# Patient Record
Sex: Female | Born: 1955 | Race: White | Hispanic: No | Marital: Married | Smoking: Never smoker
Health system: Southern US, Community
[De-identification: ages and names within clinical notes are randomized; demographics above are authoritative.]

## PROBLEM LIST (undated history)

## (undated) DIAGNOSIS — I82409 Acute embolism and thrombosis of unspecified deep veins of unspecified lower extremity: Secondary | ICD-10-CM

## (undated) DIAGNOSIS — J189 Pneumonia, unspecified organism: Secondary | ICD-10-CM

## (undated) DIAGNOSIS — M329 Systemic lupus erythematosus, unspecified: Secondary | ICD-10-CM

## (undated) DIAGNOSIS — I639 Cerebral infarction, unspecified: Secondary | ICD-10-CM

## (undated) DIAGNOSIS — M069 Rheumatoid arthritis, unspecified: Secondary | ICD-10-CM

## (undated) DIAGNOSIS — M797 Fibromyalgia: Secondary | ICD-10-CM

## (undated) DIAGNOSIS — C4491 Basal cell carcinoma of skin, unspecified: Secondary | ICD-10-CM

## (undated) DIAGNOSIS — R011 Cardiac murmur, unspecified: Secondary | ICD-10-CM

## (undated) DIAGNOSIS — D649 Anemia, unspecified: Secondary | ICD-10-CM

## (undated) DIAGNOSIS — D6851 Activated protein C resistance: Secondary | ICD-10-CM

## (undated) DIAGNOSIS — I10 Essential (primary) hypertension: Secondary | ICD-10-CM

## (undated) DIAGNOSIS — F419 Anxiety disorder, unspecified: Secondary | ICD-10-CM

## (undated) DIAGNOSIS — N390 Urinary tract infection, site not specified: Secondary | ICD-10-CM

## (undated) DIAGNOSIS — IMO0002 Reserved for concepts with insufficient information to code with codable children: Secondary | ICD-10-CM

## (undated) DIAGNOSIS — Z9109 Other allergy status, other than to drugs and biological substances: Secondary | ICD-10-CM

## (undated) DIAGNOSIS — R569 Unspecified convulsions: Secondary | ICD-10-CM

## (undated) HISTORY — DX: Fibromyalgia: M79.7

## (undated) HISTORY — DX: Cerebral infarction, unspecified: I63.9

## (undated) HISTORY — DX: Basal cell carcinoma of skin, unspecified: C44.91

## (undated) HISTORY — DX: Reserved for concepts with insufficient information to code with codable children: IMO0002

## (undated) HISTORY — DX: Other allergy status, other than to drugs and biological substances: Z91.09

## (undated) HISTORY — DX: Unspecified convulsions: R56.9

## (undated) HISTORY — PX: OTHER SURGICAL HISTORY: SHX169

## (undated) HISTORY — DX: Systemic lupus erythematosus, unspecified: M32.9

## (undated) HISTORY — DX: Rheumatoid arthritis, unspecified: M06.9

## (undated) HISTORY — DX: Activated protein C resistance: D68.51

## (undated) HISTORY — PX: KNEE ARTHROSCOPY: SUR90

## (undated) HISTORY — PX: TONSILLECTOMY AND ADENOIDECTOMY: SUR1326

## (undated) HISTORY — PX: EYE SURGERY: SHX253

---

## 1998-12-18 ENCOUNTER — Other Ambulatory Visit: Admission: RE | Admit: 1998-12-18 | Discharge: 1998-12-18 | Payer: Self-pay | Admitting: Obstetrics and Gynecology

## 2000-05-07 ENCOUNTER — Other Ambulatory Visit: Admission: RE | Admit: 2000-05-07 | Discharge: 2000-05-07 | Payer: Self-pay | Admitting: Obstetrics and Gynecology

## 2000-10-08 ENCOUNTER — Ambulatory Visit (HOSPITAL_COMMUNITY): Admission: RE | Admit: 2000-10-08 | Discharge: 2000-10-08 | Payer: Self-pay | Admitting: *Deleted

## 2001-08-31 ENCOUNTER — Other Ambulatory Visit: Admission: RE | Admit: 2001-08-31 | Discharge: 2001-08-31 | Payer: Self-pay | Admitting: Obstetrics & Gynecology

## 2002-09-03 ENCOUNTER — Other Ambulatory Visit: Admission: RE | Admit: 2002-09-03 | Discharge: 2002-09-03 | Payer: Self-pay | Admitting: Obstetrics & Gynecology

## 2004-01-16 ENCOUNTER — Other Ambulatory Visit: Admission: RE | Admit: 2004-01-16 | Discharge: 2004-01-16 | Payer: Self-pay | Admitting: Obstetrics and Gynecology

## 2011-08-01 LAB — HM MAMMOGRAPHY

## 2012-04-13 ENCOUNTER — Ambulatory Visit (INDEPENDENT_AMBULATORY_CARE_PROVIDER_SITE_OTHER): Payer: Managed Care, Other (non HMO) | Admitting: Family Medicine

## 2012-04-13 ENCOUNTER — Encounter: Payer: Self-pay | Admitting: Family Medicine

## 2012-04-13 VITALS — BP 114/76 | HR 83 | Temp 99.1°F | Ht 69.0 in | Wt 141.0 lb

## 2012-04-13 DIAGNOSIS — R51 Headache: Secondary | ICD-10-CM

## 2012-04-13 DIAGNOSIS — R519 Headache, unspecified: Secondary | ICD-10-CM | POA: Insufficient documentation

## 2012-04-13 DIAGNOSIS — R002 Palpitations: Secondary | ICD-10-CM

## 2012-04-13 DIAGNOSIS — R413 Other amnesia: Secondary | ICD-10-CM

## 2012-04-13 DIAGNOSIS — R350 Frequency of micturition: Secondary | ICD-10-CM

## 2012-04-13 DIAGNOSIS — IMO0001 Reserved for inherently not codable concepts without codable children: Secondary | ICD-10-CM

## 2012-04-13 DIAGNOSIS — H9209 Otalgia, unspecified ear: Secondary | ICD-10-CM | POA: Insufficient documentation

## 2012-04-13 LAB — POCT URINALYSIS DIPSTICK
Blood, UA: NEGATIVE
Protein, UA: NEGATIVE
Spec Grav, UA: 1.01
Urobilinogen, UA: 0.2

## 2012-04-13 NOTE — Progress Notes (Signed)
  Subjective:    Patient ID: Danielle Villegas, female    DOB: 21-Dec-1955, 56 y.o.   MRN: [id number]  HPI Pt here to establish.  She c/o b/l ear pain at night only when she lies down on her ear and then when she turns over and lays on other ear and then it hurts.  Occurs everynight and it is affecting her sleep.  It is no affecting her hearing.   Her memory is also troubling her.   Her daughter has noticed it as well.  She repeats things frequently --- questions etc.    Pt is also having a lot of headaches but she normally does with change in barametric pressure.  No vision changes. Pt also c/o odor in urine.  Review of Systems    as above Objective:   Physical Exam  Constitutional: She is oriented to person, place, and time. She appears well-developed and well-nourished.  HENT:  Right Ear: External ear normal.  Left Ear: External ear normal.  Mouth/Throat: Oropharynx is clear and moist.  Neck: Normal range of motion. Neck supple.  Cardiovascular: Normal rate, regular rhythm and normal heart sounds.   No murmur heard. Pulmonary/Chest: Effort normal and breath sounds normal. No respiratory distress. She has no wheezes. She has no rales.  Musculoskeletal: Normal range of motion. She exhibits no edema.  Neurological: She is alert and oriented to person, place, and time.       mmse  30/30  Psychiatric: She has a normal mood and affect. Her behavior is normal. Judgment and thought content normal.          Assessment & Plan:  Headaches and ear pain -- with some pnd                   claritin or zyrtec with dymista                    rto prn-------------------pt did not want referral yet   Memory loss--  Check labs ,  MMSE  30/30                        Urinary odor?---ua normal, culture sent,  rto for pelvic exam prn

## 2012-04-13 NOTE — Patient Instructions (Signed)
Headache and Allergies  The relationship between allergies and headaches is unclear. Many people with allergic or infectious nasal problems also have headaches (migraines or sinus headaches). However, sometimes allergies can cause pressure that feels like a headache, and sometimes headaches can cause allergy-like symptoms. It is not always clear whether your symptoms are caused by allergies or by a headache.  CAUSES    Migraine: The cause of a migraine is not always known.   Sinus Headache: The cause of a sinus headache may be a sinus infection. Other conditions that may be related to sinus headaches include:   Hay fever (allergic rhinitis).   Deviation of the nasal septum.   Swelling or clogging of the nasal passages.  SYMPTOMS   Migraine headache symptoms (which often last 4 to 72 hours) include:   Intense, throbbing pain on one or both sides of the head.   Nausea.   Vomiting.   Being extra sensitive to light.   Being extra sensitive to sound.   Nervous system reactions that appear similar to an allergic reaction:   Stuffy nose.   Runny nose.   Tearing.  Sinus headaches are felt as facial pain or pressure.   DIAGNOSIS   Because there is some overlap in symptoms, sinus and migraine headaches are often misdiagnosed. For example, a person with migraines may also feel facial pressure. Likewise, many people with hay fever may get migraine headaches rather than sinus headaches. These migraines can be triggered by the histamine release during an allergic reaction. An antihistamine medicine can eliminate this pain.  There are standard criteria that help clarify the difference between these headaches and related allergy or allergy-like symptoms. Your caregiver can use these criteria to determine the proper diagnosis and provide you the best care.  TREATMENT   Migraine medicine may help people who have persistent migraine headaches even though their hay fever is controlled. For some people, anti-inflammatory  treatments do not work to relieve migraines. Medicines called triptans (such as sumatriptan) can be helpful for those people.  Document Released: 12/07/2003 Document Revised: 09/05/2011 Document Reviewed: 12/29/2009  [employer] Patient Information 2012 [employer].

## 2012-04-14 LAB — BASIC METABOLIC PANEL
BUN: 22 mg/dL (ref 6–23)
Calcium: 9.6 mg/dL (ref 8.4–10.5)
GFR: 98.09 mL/min (ref >60.00)
Glucose, Bld: 111 mg/dL — ABNORMAL HIGH (ref 70–99)

## 2012-04-14 LAB — CBC WITH DIFFERENTIAL/PLATELET
Basophils Absolute: 0 10*3/uL (ref 0.0–0.1)
Lymphocytes Relative: 7.1 % — ABNORMAL LOW (ref 12.0–46.0)
Lymphs Abs: 0.3 10*3/uL — ABNORMAL LOW (ref 0.7–4.0)
Monocytes Relative: 10.5 % (ref 3.0–12.0)
Platelets: 167 10*3/uL (ref 150.0–400.0)
RDW: 13.2 % (ref 11.5–14.6)

## 2012-04-14 LAB — HEPATIC FUNCTION PANEL
AST: 30 U/L (ref 0–37)
Alkaline Phosphatase: 74 U/L (ref 39–117)
Total Bilirubin: 0.7 mg/dL (ref 0.3–1.2)

## 2012-04-14 LAB — SEDIMENTATION RATE: Sed Rate: 21 mm/hr (ref 0–22)

## 2012-04-15 LAB — TSH: TSH: 0.94 u[IU]/mL (ref 0.35–5.50)

## 2012-04-16 LAB — URINE CULTURE

## 2012-04-17 ENCOUNTER — Other Ambulatory Visit: Payer: Self-pay

## 2012-04-17 MED ORDER — CIPROFLOXACIN HCL 250 MG PO TABS: 250.0000 mg | ORAL_TABLET | Freq: Two times a day (BID) | ORAL | Status: AC

## 2012-04-17 MED ORDER — PREDNISONE 1 MG PO TABS: 2.0000 mg | ORAL_TABLET | Freq: Every day | ORAL | Status: DC

## 2012-04-17 MED ORDER — PREDNISONE 5 MG PO TABS: 5.0000 mg | ORAL_TABLET | Freq: Every day | ORAL | Status: DC

## 2012-04-17 NOTE — Telephone Encounter (Signed)
+   UTI cipro 250 mg bid for 3 days

## 2012-04-20 MED ORDER — PREDNISONE 1 MG PO TABS: 2.0000 mg | ORAL_TABLET | Freq: Every day | ORAL | Status: DC

## 2012-04-20 MED ORDER — PREDNISONE 5 MG PO TABS: 5.0000 mg | ORAL_TABLET | Freq: Every day | ORAL | Status: DC

## 2012-04-20 NOTE — Telephone Encounter (Signed)
Discussed with patient and she voiced understanding Rx sent to the pharmacy in [place], and she also requested prednisone to be sent to [facility].     KP

## 2012-04-20 NOTE — Telephone Encounter (Signed)
E-scribing error on Rx---states the Rx did not go through. Refaxed     KP

## 2012-04-20 NOTE — Addendum Note (Signed)
Addended by: Arnette Norris on: 04/20/2012 08:20 AM   Modules accepted: Orders

## 2012-05-12 ENCOUNTER — Telehealth: Payer: Self-pay | Admitting: *Deleted

## 2012-05-12 NOTE — Telephone Encounter (Signed)
Pt states that when she was seen on 04-13-12 she was advised that she had a premature heart beat. Pt would like to know where in the heart this is located..Please advise

## 2012-05-12 NOTE — Telephone Encounter (Signed)
Premature atrial beat

## 2012-05-12 NOTE — Telephone Encounter (Signed)
Patient aware and she voiced understanding.     KP 

## 2012-06-29 ENCOUNTER — Ambulatory Visit (INDEPENDENT_AMBULATORY_CARE_PROVIDER_SITE_OTHER): Payer: Managed Care, Other (non HMO)

## 2012-06-29 DIAGNOSIS — Z23 Encounter for immunization: Secondary | ICD-10-CM

## 2012-09-10 ENCOUNTER — Emergency Department (HOSPITAL_COMMUNITY): Payer: Managed Care, Other (non HMO)

## 2012-09-10 ENCOUNTER — Emergency Department (HOSPITAL_COMMUNITY)
Admission: EM | Admit: 2012-09-10 | Discharge: 2012-09-10 | Disposition: A | Discharge status: Home / Self Care | Payer: Managed Care, Other (non HMO) | Attending: Emergency Medicine | Admitting: Emergency Medicine

## 2012-09-10 ENCOUNTER — Encounter (HOSPITAL_COMMUNITY): Payer: Self-pay | Admitting: Emergency Medicine

## 2012-09-10 DIAGNOSIS — R269 Unspecified abnormalities of gait and mobility: Secondary | ICD-10-CM | POA: Insufficient documentation

## 2012-09-10 DIAGNOSIS — M329 Systemic lupus erythematosus, unspecified: Secondary | ICD-10-CM | POA: Insufficient documentation

## 2012-09-10 DIAGNOSIS — Z7982 Long term (current) use of aspirin: Secondary | ICD-10-CM | POA: Insufficient documentation

## 2012-09-10 DIAGNOSIS — C4491 Basal cell carcinoma of skin, unspecified: Secondary | ICD-10-CM | POA: Insufficient documentation

## 2012-09-10 DIAGNOSIS — IMO0001 Reserved for inherently not codable concepts without codable children: Secondary | ICD-10-CM | POA: Insufficient documentation

## 2012-09-10 DIAGNOSIS — Z79899 Other long term (current) drug therapy: Secondary | ICD-10-CM | POA: Insufficient documentation

## 2012-09-10 DIAGNOSIS — Y939 Activity, unspecified: Secondary | ICD-10-CM | POA: Insufficient documentation

## 2012-09-10 DIAGNOSIS — Y929 Unspecified place or not applicable: Secondary | ICD-10-CM | POA: Insufficient documentation

## 2012-09-10 DIAGNOSIS — X500XXA Overexertion from strenuous movement or load, initial encounter: Secondary | ICD-10-CM | POA: Insufficient documentation

## 2012-09-10 DIAGNOSIS — M129 Arthropathy, unspecified: Secondary | ICD-10-CM | POA: Insufficient documentation

## 2012-09-10 DIAGNOSIS — D6859 Other primary thrombophilia: Secondary | ICD-10-CM | POA: Insufficient documentation

## 2012-09-10 DIAGNOSIS — Z8673 Personal history of transient ischemic attack (TIA), and cerebral infarction without residual deficits: Secondary | ICD-10-CM | POA: Insufficient documentation

## 2012-09-10 DIAGNOSIS — S83006A Unspecified dislocation of unspecified patella, initial encounter: Secondary | ICD-10-CM | POA: Insufficient documentation

## 2012-09-10 DIAGNOSIS — R61 Generalized hyperhidrosis: Secondary | ICD-10-CM | POA: Insufficient documentation

## 2012-09-10 MED ORDER — ONDANSETRON HCL 4 MG/2ML IJ SOLN
4.0000 mg | Freq: Once | INTRAMUSCULAR | Status: AC
  Administered 2012-09-10: 4 mg via INTRAVENOUS
  Filled 2012-09-10: qty 2

## 2012-09-10 MED ORDER — HYDROMORPHONE HCL PF 2 MG/ML IJ SOLN
2.0000 mg | Freq: Once | INTRAMUSCULAR | Status: AC
  Administered 2012-09-10: 2 mg via INTRAVENOUS
  Filled 2012-09-10: qty 1

## 2012-09-10 MED ORDER — HYDROMORPHONE HCL PF 1 MG/ML IJ SOLN
1.0000 mg | Freq: Once | INTRAMUSCULAR | Status: AC
  Administered 2012-09-10: 1 mg via INTRAVENOUS

## 2012-09-10 MED ORDER — OXYCODONE-ACETAMINOPHEN 5-325 MG PO TABS
1.0000 | ORAL_TABLET | ORAL | Status: DC | PRN
Start: 2012-09-10 — End: 2012-09-14

## 2012-09-10 MED ORDER — HYDROMORPHONE HCL PF 1 MG/ML IJ SOLN
INTRAMUSCULAR | Status: AC
  Administered 2012-09-10: 1 mg via INTRAVENOUS
  Filled 2012-09-10: qty 1

## 2012-09-10 NOTE — ED Provider Notes (Signed)
History     CSN: [id number]  Arrival date & time 09/10/12  1123   First MD Initiated Contact with Patient 09/10/12 1228      Chief Complaint  Patient presents with  . Knee Injury    (Consider location/radiation/quality/duration/timing/severity/associated sxs/prior treatment) Patient is a 56 y.o. female presenting with knee pain. The history is provided by the patient and the spouse. No language interpreter was used.  Knee Pain This is a new problem. The current episode started today. The problem occurs constantly. The problem has been unchanged. Associated symptoms include diaphoresis. Pertinent negatives include no fever, nausea, numbness or vomiting. The symptoms are aggravated by bending. She has tried nothing for the symptoms.   56 year old female here today with a complaint of left knee pain after stepping off curve. Defomity noted. + cms below injury.  Prior injury to the knee in the remote past.    Past Medical History  Diagnosis Date  . Basal cell carcinoma   . Fibromyalgia   . Lupus   . RA (rheumatoid arthritis)   . Factor 5 Leiden mutation, heterozygous   . Environmental allergies   . Stroke     1997  . Seizure     1997    Past Surgical History  Procedure Date  . Tonsillectomy and adenoidectomy     Family History  Problem Relation Age of Onset  . Alcohol abuse    . Arthritis    . Breast cancer    . Prostate cancer Father   . Stroke    . Hypertension    . Heart disease      History  Substance Use Topics  . Smoking status: Never Smoker   . Smokeless tobacco: Never Used  . Alcohol Use: Yes     Comment: Rarely    OB History    Grav Para Term Preterm Abortions TAB SAB Ect Mult Living                  Review of Systems  Constitutional: Positive for diaphoresis. Negative for fever.  HENT: Negative.   Eyes: Negative.   Respiratory: Negative.   Cardiovascular: Negative.   Gastrointestinal: Negative.  Negative for nausea and vomiting.   Musculoskeletal: Positive for gait problem.       L knee pain  Neurological: Negative.  Negative for numbness.  Psychiatric/Behavioral: Negative.   All other systems reviewed and are negative.    Allergies  Dilantin and Sulfa antibiotics  Home Medications   Current Outpatient Rx  Name  Route  Sig  Dispense  Refill  . ASPIRIN EC 81 MG PO TBEC   Oral   Take 162 mg by mouth daily.         . LUTEIN PO   Oral   Take 1 tablet by mouth daily.         . ADULT MULTIVITAMIN W/MINERALS CH   Oral   Take 1 tablet by mouth daily.         Marland Kitchen PREDNISONE 1 MG PO TABS   Oral   Take 2 tablets (2 mg total) by mouth daily.   180 tablet   3   . PREDNISONE 5 MG PO TABS   Oral   Take 1 tablet (5 mg total) by mouth daily.   90 tablet   3     BP 171/78  Pulse 78  Temp 97.5 F (36.4 C) (Oral)  Resp 20  SpO2 98%  Physical Exam  Nursing note and vitals  reviewed. Constitutional: She is oriented to person, place, and time. She appears well-developed and well-nourished.  HENT:  Head: Normocephalic and atraumatic.  Eyes: Conjunctivae normal and EOM are normal. Pupils are equal, round, and reactive to light.  Neck: Normal range of motion. Neck supple.  Cardiovascular: Normal rate.   Pulmonary/Chest: Effort normal.  Abdominal: Soft.  Musculoskeletal: She exhibits edema and tenderness.       L knee tenderness and deformity.  + cs below injury Limited rom due to pain  Neurological: She is alert and oriented to person, place, and time. She has normal reflexes.  Skin: Skin is warm and dry.  Psychiatric: She has a normal mood and affect.    ED Course  Reduction of dislocation Date/Time: 09/10/2012 2:49 PM Performed by: Remi Haggard Authorized by: Remi Haggard Consent: Verbal consent obtained. Written consent not obtained. Risks and benefits: risks, benefits and alternatives were discussed Consent given by: patient Patient understanding: patient states understanding of the  procedure being performed Imaging studies: imaging studies available Patient identity confirmed: verbally with patient Time out: Immediately prior to procedure a "time out" was called to verify the correct patient, procedure, equipment, support staff and site/side marked as required. Patient tolerance: Patient tolerated the procedure well with no immediate complications. Comments: L patella lateral dislocation reduced by resident at bedside after 3 mg of dilaudid.   (including critical care time)  Labs Reviewed - No data to display Dg Knee 1-2 Views Left  09/10/2012  *RADIOLOGY REPORT*  Clinical Data: Injured knee stepping off curve  LEFT KNEE - 1-2 VIEW  Comparison: None.  Findings: On the frontal view the patella appears to be dislocated laterally.  No acute fracture is seen.  Joint effusion is difficult to assess.  IMPRESSION: Apparent lateral dislocation of the patella.  Correlate clinically.   Original Report Authenticated By: Dwyane Dee, M.D.      No diagnosis found.    MDM  L patella dislocation reduced.  Dilaudid 3 mg given prior.  Joint effusion noted after reduction.  Leg immobilizer and crutches provided.  Follow up with orthopedics.  + cms below injury.  No fracture per x-ray reviewed by myself.  Shared visit with Dr. Clarene Duke and resident.        Remi Haggard, NP 09/10/12 2152

## 2012-09-10 NOTE — Progress Notes (Signed)
Orthopedic Tech Progress Note Patient Details:  Danielle Villegas 1956/09/24 [id number]  Ortho Devices Type of Ortho Device: Knee Immobilizer Ortho Device/Splint Location: left knee Ortho Device/Splint Interventions: Application   Eidan Muellner 09/10/2012, 1:16 PM

## 2012-09-10 NOTE — Progress Notes (Signed)
Orthopedic Tech Progress Note Patient Details:  Danielle Villegas [birth year] [id number]  Patient ID: Danielle Villegas, female   DOB: October 24, 1955, 56 y.o.   MRN: [id number] Pt already has crutches;rn notified  Nikki Dom 09/10/2012, 1:16 PM

## 2012-09-10 NOTE — Progress Notes (Signed)
Orthopedic Tech Progress Note Patient Details:  Danielle Villegas 27-Jan-1956 [id number]  Ortho Devices Type of Ortho Device: Crutches Ortho Device/Splint Location: left knee Ortho Device/Splint Interventions: Application   [name] 09/10/2012, 2:42 PM

## 2012-09-10 NOTE — ED Notes (Signed)
Patient tripped and developed pain left knee. Lowered self to ground.  EMS called stated obvious deformity left knee cap. History of dislocating left knee.  Pain improved with total of Fentanyl IVP. Pedal pulse +2 and able to move all toes.

## 2012-09-11 ENCOUNTER — Encounter (HOSPITAL_COMMUNITY): Payer: Self-pay | Admitting: Emergency Medicine

## 2012-09-11 ENCOUNTER — Emergency Department (HOSPITAL_COMMUNITY): Payer: Managed Care, Other (non HMO)

## 2012-09-11 ENCOUNTER — Observation Stay (HOSPITAL_COMMUNITY)
Admission: EM | Admit: 2012-09-11 | Discharge: 2012-09-14 | Disposition: A | Discharge status: Skilled Nursing Facility | Payer: Managed Care, Other (non HMO) | Attending: Family Medicine | Admitting: Family Medicine

## 2012-09-11 ENCOUNTER — Telehealth: Payer: Self-pay | Admitting: Family Medicine

## 2012-09-11 DIAGNOSIS — S83006A Unspecified dislocation of unspecified patella, initial encounter: Principal | ICD-10-CM | POA: Diagnosis present

## 2012-09-11 DIAGNOSIS — M069 Rheumatoid arthritis, unspecified: Secondary | ICD-10-CM | POA: Insufficient documentation

## 2012-09-11 DIAGNOSIS — W1789XA Other fall from one level to another, initial encounter: Secondary | ICD-10-CM | POA: Insufficient documentation

## 2012-09-11 DIAGNOSIS — Z8673 Personal history of transient ischemic attack (TIA), and cerebral infarction without residual deficits: Secondary | ICD-10-CM | POA: Insufficient documentation

## 2012-09-11 DIAGNOSIS — M329 Systemic lupus erythematosus, unspecified: Secondary | ICD-10-CM | POA: Insufficient documentation

## 2012-09-11 DIAGNOSIS — Z7982 Long term (current) use of aspirin: Secondary | ICD-10-CM | POA: Insufficient documentation

## 2012-09-11 DIAGNOSIS — D649 Anemia, unspecified: Secondary | ICD-10-CM

## 2012-09-11 DIAGNOSIS — D696 Thrombocytopenia, unspecified: Secondary | ICD-10-CM

## 2012-09-11 DIAGNOSIS — I824Z9 Acute embolism and thrombosis of unspecified deep veins of unspecified distal lower extremity: Secondary | ICD-10-CM

## 2012-09-11 DIAGNOSIS — IMO0002 Reserved for concepts with insufficient information to code with codable children: Secondary | ICD-10-CM | POA: Insufficient documentation

## 2012-09-11 DIAGNOSIS — R509 Fever, unspecified: Secondary | ICD-10-CM | POA: Diagnosis present

## 2012-09-11 DIAGNOSIS — D6859 Other primary thrombophilia: Secondary | ICD-10-CM | POA: Insufficient documentation

## 2012-09-11 LAB — CBC
HCT: 34.5 % — ABNORMAL LOW (ref 36.0–46.0)
Hemoglobin: 11.3 g/dL — ABNORMAL LOW (ref 12.0–15.0)
MCH: 30.1 pg (ref 26.0–34.0)
MCHC: 32.8 g/dL (ref 30.0–36.0)
MCV: 92 fL (ref 78.0–100.0)
Platelets: 146 10*3/uL — ABNORMAL LOW (ref 150–400)
RBC: 3.75 MIL/uL — ABNORMAL LOW (ref 3.87–5.11)
RDW: 13.6 % (ref 11.5–15.5)
WBC: 6.2 10*3/uL (ref 4.0–10.5)

## 2012-09-11 LAB — POCT I-STAT, CHEM 8
BUN: 14 mg/dL (ref 6–23)
Calcium, Ion: 1.12 mmol/L (ref 1.12–1.23)
Chloride: 107 mEq/L (ref 96–112)
Creatinine, Ser: 0.8 mg/dL (ref 0.50–1.10)
Glucose, Bld: 106 mg/dL — ABNORMAL HIGH (ref 70–99)
HCT: 34 % — ABNORMAL LOW (ref 36.0–46.0)
Hemoglobin: 11.6 g/dL — ABNORMAL LOW (ref 12.0–15.0)
Potassium: 3.7 mEq/L (ref 3.5–5.1)
Sodium: 140 mEq/L (ref 135–145)
TCO2: 22 mmol/L (ref 0–100)

## 2012-09-11 MED ORDER — HEPARIN SODIUM (PORCINE) 5000 UNIT/ML IJ SOLN
5000.0000 [IU] | Freq: Three times a day (TID) | INTRAMUSCULAR | Status: DC
  Administered 2012-09-11 – 2012-09-12 (×2): 5000 [IU] via SUBCUTANEOUS
  Filled 2012-09-11 (×6): qty 1

## 2012-09-11 MED ORDER — DIAZEPAM 5 MG/ML IJ SOLN
5.0000 mg | Freq: Four times a day (QID) | INTRAMUSCULAR | Status: DC | PRN
  Administered 2012-09-11 – 2012-09-12 (×2): 5 mg via INTRAVENOUS
  Filled 2012-09-11 (×2): qty 2

## 2012-09-11 MED ORDER — ACETAMINOPHEN 325 MG PO TABS
650.0000 mg | ORAL_TABLET | Freq: Four times a day (QID) | ORAL | Status: DC | PRN
  Administered 2012-09-11: 650 mg via ORAL
  Filled 2012-09-11: qty 2

## 2012-09-11 MED ORDER — FENTANYL CITRATE 0.05 MG/ML IJ SOLN
100.0000 ug | Freq: Once | INTRAMUSCULAR | Status: AC
  Administered 2012-09-11: 100 ug via INTRAVENOUS
  Filled 2012-09-11: qty 2

## 2012-09-11 MED ORDER — ASPIRIN EC 81 MG PO TBEC
162.0000 mg | DELAYED_RELEASE_TABLET | Freq: Every day | ORAL | Status: DC
  Administered 2012-09-12: 162 mg via ORAL
  Filled 2012-09-11: qty 2

## 2012-09-11 MED ORDER — DIAZEPAM 5 MG/ML IJ SOLN
5.0000 mg | Freq: Once | INTRAMUSCULAR | Status: AC
Start: 2012-09-11 — End: 2012-09-11
  Administered 2012-09-11: 5 mg via INTRAVENOUS
  Filled 2012-09-11: qty 2

## 2012-09-11 MED ORDER — SODIUM CHLORIDE 0.9 % IV SOLN: 250.0000 mL | INTRAVENOUS | Status: DC | PRN

## 2012-09-11 MED ORDER — HYDROMORPHONE HCL PF 1 MG/ML IJ SOLN
0.5000 mg | INTRAMUSCULAR | Status: DC | PRN
  Administered 2012-09-12 – 2012-09-13 (×4): 1 mg via INTRAVENOUS
  Filled 2012-09-11 (×5): qty 1

## 2012-09-11 MED ORDER — SODIUM CHLORIDE 0.9 % IJ SOLN: 3.0000 mL | INTRAMUSCULAR | Status: DC | PRN

## 2012-09-11 MED ORDER — ASPIRIN 81 MG PO CHEW
CHEWABLE_TABLET | ORAL | Status: AC
  Filled 2012-09-11: qty 2

## 2012-09-11 MED ORDER — SODIUM CHLORIDE 0.9 % IV SOLN
INTRAVENOUS | Status: AC
  Administered 2012-09-11: 22:00:00 via INTRAVENOUS

## 2012-09-11 MED ORDER — SODIUM CHLORIDE 0.9 % IJ SOLN
3.0000 mL | Freq: Two times a day (BID) | INTRAMUSCULAR | Status: DC
  Administered 2012-09-12 – 2012-09-13 (×3): 3 mL via INTRAVENOUS

## 2012-09-11 MED ORDER — ADULT MULTIVITAMIN W/MINERALS CH
1.0000 | ORAL_TABLET | Freq: Every day | ORAL | Status: DC
  Administered 2012-09-11 – 2012-09-14 (×4): 1 via ORAL
  Filled 2012-09-11 (×4): qty 1

## 2012-09-11 MED ORDER — PREDNISONE 1 MG PO TABS
2.0000 mg | ORAL_TABLET | Freq: Every day | ORAL | Status: DC
  Administered 2012-09-12 – 2012-09-14 (×3): 2 mg via ORAL
  Filled 2012-09-11 (×3): qty 2

## 2012-09-11 MED ORDER — PREDNISONE 5 MG PO TABS
5.0000 mg | ORAL_TABLET | Freq: Every day | ORAL | Status: DC
  Administered 2012-09-12 – 2012-09-14 (×3): 5 mg via ORAL
  Filled 2012-09-11 (×3): qty 1

## 2012-09-11 NOTE — ED Notes (Signed)
Rx for Flexeril 10 mg 1 TID PRN muscle Spasms #20 per Dr Adriana Simas.

## 2012-09-11 NOTE — Telephone Encounter (Signed)
Patient is in the ED 09/11/12---   KP

## 2012-09-11 NOTE — Telephone Encounter (Signed)
Attempted to reach patient without success.  Left identified message on voicemail to return call as needed.

## 2012-09-11 NOTE — ED Notes (Signed)
Per EMS: Pt seen yesterday for fall, dislocated left patella, pt given knee immobilizer and instructions to follow up with ortho. Pt reports left knee to hip muscle spasms. Pt reports 10/10 pain with activity. 3/10 pain at rest. 250 mcg Fentanyl given by EMS.

## 2012-09-11 NOTE — ED Provider Notes (Addendum)
History     CSN: [id number]  Arrival date & time 09/11/12  1453   First MD Initiated Contact with Patient 09/11/12 1521      Chief Complaint  Patient presents with  . Spasms  . Fall    (Consider location/radiation/quality/duration/timing/severity/associated sxs/prior treatment) Patient is a 56 y.o. female presenting with fall. The history is provided by the patient.  Fall Pertinent negatives include no fever and no numbness.  patient stepped of a curb yesterday and dislocated her left patella. Previous history of "floating patella", but has not previously dislocated it. Since she was in the ED yesterday she has had spasms that come and go. It goes from the knee to the hip. It is severe. Worse with movement. She states that she has been unable to get up to the second floor of her house.   Past Medical History  Diagnosis Date  . Basal cell carcinoma   . Fibromyalgia   . Lupus   . RA (rheumatoid arthritis)   . Factor 5 Leiden mutation, heterozygous   . Environmental allergies   . Stroke     1997  . Seizure     1997    Past Surgical History  Procedure Date  . Tonsillectomy and adenoidectomy     Family History  Problem Relation Age of Onset  . Alcohol abuse    . Arthritis    . Breast cancer    . Prostate cancer Father   . Stroke    . Hypertension    . Heart disease      History  Substance Use Topics  . Smoking status: Never Smoker   . Smokeless tobacco: Never Used  . Alcohol Use: Yes     Comment: Rarely    OB History    Grav Para Term Preterm Abortions TAB SAB Ect Mult Living                  Review of Systems  Constitutional: Negative for fever and chills.  Musculoskeletal: Positive for joint swelling. Negative for back pain.  Skin: Negative for wound.  Neurological: Negative for dizziness, tremors and numbness.    Allergies  Dilantin and Sulfa antibiotics  Home Medications   No current outpatient prescriptions on file.  BP 139/83  Pulse 99   Temp 100.1 F (37.8 C) (Oral)  Resp 17  Ht 5\' 9"  (1.753 m)  Wt 145 lb (65.772 kg)  BMI 21.41 kg/m2  SpO2 99%  Physical Exam  Constitutional: She appears well-developed and well-nourished.  Cardiovascular: Normal rate and regular rhythm.   Pulmonary/Chest: Effort normal and breath sounds normal. No respiratory distress.  Abdominal: There is no tenderness.  Musculoskeletal:       Patient has brief spasms of left lower extremity. Pain with palpation of knee.   Neurological: She is alert.  NV intact distally in LLE. Mild edema.   ED Course  Procedures (including critical care time)  Labs Reviewed  CBC - Abnormal; Notable for the following:    RBC 3.75 (*)     Hemoglobin 11.3 (*)     HCT 34.5 (*)     Platelets 146 (*)     All other components within normal limits  POCT I-STAT, CHEM 8 - Abnormal; Notable for the following:    Glucose, Bld 106 (*)     Hemoglobin 11.6 (*)     HCT 34.0 (*)     All other components within normal limits  CBC - Abnormal; Notable for the following:  RBC 3.54 (*)     Hemoglobin 10.7 (*)     HCT 32.6 (*)     Platelets 120 (*)     All other components within normal limits  GLUCOSE, CAPILLARY  CULTURE, BLOOD (ROUTINE X 2)  CULTURE, BLOOD (ROUTINE X 2)  URINALYSIS, ROUTINE W REFLEX MICROSCOPIC  URINE CULTURE   Dg Knee 2 Views Left  09/11/2012  *RADIOLOGY REPORT*  Clinical Data: Knee pain and spasms.  Patellar dislocation yesterday.  LEFT KNEE - 1-2 VIEW  Comparison: 09/10/2012  Findings: Two-view exam shows no gross fracture.  No subluxation or dislocation.  There is a joint effusion.  Meniscal mineralization again noted.  IMPRESSION: No evidence for patellar dislocation.  No gross fracture.  Joint effusion.   Original Report Authenticated By: Kennith Center, M.D.    Mr Knee Left  Wo Contrast  09/11/2012  *RADIOLOGY REPORT*  Clinical Data:  Left knee pain after fall.  MRI OF THE LEFT KNEE WITHOUT CONTRAST  Technique:  Multiplanar, multisequence MR  imaging of the left knee was performed.  No intravenous contrast was administered.  Comparison:  Radiographs dated 12/13 and 09/10/2012  FINDINGS: There is evidence of an acute transient lateral dislocation of the patella with a subtle impaction fracture of the lateral aspect of the lateral femoral condyle and at a tiny area of avulsion of the inferior medial aspect of the patella as well as a partial avulsion of the medial patellofemoral ligament from the patella.                              MENISCI Medial:  Normal. Lateral:  Normal.  LIGAMENTS Cruciates:  Normal. Collaterals:  Normal.  CARTILAGE Patellofemoral:  The patient has a sheared off a large full- thickness portion of the articular cartilage of the lateral facet of the patella measuring at least 2.6 x 2.3 cm. Medial:  Normal. Lateral:  Normal.  Joint:  Large joint effusion with blood products and fat globules from the tear is and subtle fractures. Popliteal Fossa:  No Baker's cyst.  Extravasated joint fluid in the soft tissues.  Extensor Mechanism: Distal quadriceps tendon and patellar tendon are normal.  Extensive partial tear of the medial patellofemoral ligament at the patellar attachment with a small avulsion of the cortex of the patella inferior medially. Bones: Subtle impaction fracture of the lateral aspect of the lateral femoral condyle due to the transient patellar dislocation. Small avulsion of bone from the inferior medial aspect of the patella.  These are both quite subtle injuries.  IMPRESSION:  1.  Evidence of recent transient dislocation of the patella with secondary large joint effusion and hemarthrosis. 2.  Subtle impaction fracture of the lateral aspect of the lateral femoral condyle. 3.  Partial avulsion of the medial patellofemoral ligament with a small avulsion of bone from the inferior medial aspect of patella. 4.  Large avulsed full-thickness segment of cartilage from the lateral facet of the patella.   Original Report Authenticated  By: Francene Boyers, M.D.      1. Patellar dislocation   2. Fever       MDM  Patient with left knee pain after fall day prior to arrival. Patellar dislocation at that time that was reduced in the ED. Pain uncontrolled and has spasms. No relief with flexeril at home. MRI done here after discussion with ortho. Patient unable to manage at home and was admitted to medicine. No chest pain.  History of Factor 5 leiden. No chest pain at this time.      Juliet Rude. Rubin Payor, MD 09/12/12 1522  Juliet Rude. Rubin Payor, MD 09/12/12 [phone]

## 2012-09-11 NOTE — ED Provider Notes (Signed)
Medical screening examination/treatment/procedure(s) were conducted as a shared visit with non-physician practitioner(s) and myself.  I personally evaluated the patient during the encounter   56yo F, c/o left knee pain.  States "for years since I was a teenager" her left patella has "popped in and out of place."  States she was told she "needed surgery on it years ago" for "weak ligaments" but never did.  States she stepped off a curb today and "felt it go out again."  Endorses it "didn't pop back in" so she came to the ED for further eval.  A&O, very anxious, CTA, RRR, +left patellar lateral dislocation, +small joint effusion. NMS intact left foot.  Pelvis stable. NT left hip/ankle/foot. No open wounds. XR without fracture.  Medicated for pain and anxiety.  Left patella easily reduced back to anatomical position with immediate improvement in pain.  Knee immobilizer placed with knee in full extension.  XR without fracture.  Crutches given.  Strict instructions to f/u with Ortho MD given to pt and spouse.  Verb understanding.     Laray Anger, DO 09/11/12 [phone]

## 2012-09-11 NOTE — ED Notes (Signed)
Patient transported to MRI 

## 2012-09-11 NOTE — ED Notes (Signed)
Pt returned from MRI °

## 2012-09-11 NOTE — Plan of Care (Signed)
Problem: Phase I Progression Outcomes Goal: OOB as tolerated unless otherwise ordered Outcome: Not Progressing Will get up with PT tomorrow Erick Colace

## 2012-09-11 NOTE — H&P (Signed)
[facility] History and Physical  Danielle Villegas ZOX:[id number] DOB: 1956-04-04 DOA: 09/11/2012  Referring physician: ED PCP: Loreen Freud, DO  Specialists: Orthopaedic surgery  Chief Complaint: Knee pain, muscle spasms  HPI: Danielle Villegas is a 56 y.o. female who stepped off of a curb yesterday and dislocated her L patella.  Previous history of "floating patella" in the past but this the first time she dislocated it.  Was in ED yesterday due to pain with spasms that are located from the knee all the way to the hip.  Spasms and pain are severe, worse with movement, better at rest.  Her husband has had to stay home from work to care for her, she has not been able to get up to the second floor of her house.  In the ED MRI was performed with findings as noted below.  The patient was febrile with T of 102.3 after admission.  No other symptoms and no obvious source of infection to explain fever.  Review of Systems: 12 systems reviewed and negative except as per HPI.  Past Medical History  Diagnosis Date  . Basal cell carcinoma   . Fibromyalgia   . Lupus   . RA (rheumatoid arthritis)   . Factor 5 Leiden mutation, heterozygous   . Environmental allergies   . Stroke     1997  . Seizure     1997   Past Surgical History  Procedure Date  . Tonsillectomy and adenoidectomy    Social History:  reports that she has never smoked. She has never used smokeless tobacco. She reports that she drinks alcohol. She reports that she does not use illicit drugs.  Allergies  Allergen Reactions  . Dilantin (Phenytoin Sodium Extended) Other (See Comments)    Dizziness  . Sulfa Antibiotics Other (See Comments)    Body Stiffness    Family History  Problem Relation Age of Onset  . Alcohol abuse    . Arthritis    . Breast cancer    . Prostate cancer Father   . Stroke    . Hypertension    . Heart disease      Prior to Admission medications   Medication Sig Start Date End Date  Taking? Authorizing Provider  aspirin EC 81 MG tablet Take 162 mg by mouth daily.   Yes Historical Provider, MD  LUTEIN PO Take 1 tablet by mouth daily.   Yes Historical Provider, MD  Multiple Vitamin (MULTIVITAMIN WITH MINERALS) TABS Take 1 tablet by mouth daily.   Yes Historical Provider, MD  oxyCODONE-acetaminophen (PERCOCET/ROXICET) 5-325 MG per tablet Take 1 tablet by mouth every 4 (four) hours as needed for pain. 09/10/12  Yes Remi Haggard, NP  predniSONE (DELTASONE) 1 MG tablet Take 2 mg by mouth daily. Take with 5 mg every day 04/20/12  Yes Yvonne R Lowne, DO  predniSONE (DELTASONE) 5 MG tablet Take 5 mg by mouth daily. Take with 2 mg every day 04/20/12  Yes Lelon Perla, DO   Physical Exam: Filed Vitals:   09/11/12 1453 09/11/12 1502 09/11/12 1943  BP:  134/62 144/75  Pulse:  85 86  Temp:  99.5 F (37.5 C) 100.8 F (38.2 C)  TempSrc:  Oral Oral  Resp:  18 17  SpO2: 98% 98% 91%     General:  NAD, resting comfortably in bed  Eyes: PEERLA EOMI  ENT: mucous membranes moist  Neck: supple w/o JVD  Cardiovascular: RRR w/o MRG  Respiratory: CTA B  Abdomen: soft,  nt, nd, bs+  Skin: no rash nor lesion  Musculoskeletal: MAE, LLE knee is swolen, no evidence of erythema  Psychiatric: normal tone and affect  Neurologic: AAOx3, grossly non-focal   Labs on Admission:  Basic Metabolic Panel:  Lab 09/11/12 1191  NA 140  K 3.7  CL 107  CO2 --  GLUCOSE 106*  BUN 14  CREATININE 0.80  CALCIUM --  MG --  PHOS --   Liver Function Tests: No results found for this basename: AST:5,ALT:5,ALKPHOS:5,BILITOT:5,PROT:5,ALBUMIN:5 in the last 168 hours No results found for this basename: LIPASE:5,AMYLASE:5 in the last 168 hours No results found for this basename: AMMONIA:5 in the last 168 hours CBC:  Lab 09/11/12 1740 09/11/12 1733  WBC -- 6.2  NEUTROABS -- --  HGB 11.6* 11.3*  HCT 34.0* 34.5*  MCV -- 92.0  PLT -- 146*   Cardiac Enzymes: No results found for this  basename: CKTOTAL:5,CKMB:5,CKMBINDEX:5,TROPONINI:5 in the last 168 hours  BNP (last 3 results) No results found for this basename: PROBNP:3 in the last 8760 hours CBG: No results found for this basename: GLUCAP:5 in the last 168 hours  Radiological Exams on Admission: Dg Knee 2 Views Left  09/11/2012  *RADIOLOGY REPORT*  Clinical Data: Knee pain and spasms.  Patellar dislocation yesterday.  LEFT KNEE - 1-2 VIEW  Comparison: 09/10/2012  Findings: Two-view exam shows no gross fracture.  No subluxation or dislocation.  There is a joint effusion.  Meniscal mineralization again noted.  IMPRESSION: No evidence for patellar dislocation.  No gross fracture.  Joint effusion.   Original Report Authenticated By: Kennith Center, M.D.    Dg Knee 1-2 Views Left  09/10/2012  *RADIOLOGY REPORT*  Clinical Data: Injured knee stepping off curve  LEFT KNEE - 1-2 VIEW  Comparison: None.  Findings: On the frontal view the patella appears to be dislocated laterally.  No acute fracture is seen.  Joint effusion is difficult to assess.  IMPRESSION: Apparent lateral dislocation of the patella.  Correlate clinically.   Original Report Authenticated By: Dwyane Dee, M.D.    Mr Knee Left  Wo Contrast  09/11/2012  *RADIOLOGY REPORT*  Clinical Data:  Left knee pain after fall.  MRI OF THE LEFT KNEE WITHOUT CONTRAST  Technique:  Multiplanar, multisequence MR imaging of the left knee was performed.  No intravenous contrast was administered.  Comparison:  Radiographs dated 12/13 and 09/10/2012  FINDINGS: There is evidence of an acute transient lateral dislocation of the patella with a subtle impaction fracture of the lateral aspect of the lateral femoral condyle and at a tiny area of avulsion of the inferior medial aspect of the patella as well as a partial avulsion of the medial patellofemoral ligament from the patella.                              MENISCI Medial:  Normal. Lateral:  Normal.  LIGAMENTS Cruciates:  Normal. Collaterals:   Normal.  CARTILAGE Patellofemoral:  The patient has a sheared off a large full- thickness portion of the articular cartilage of the lateral facet of the patella measuring at least 2.6 x 2.3 cm. Medial:  Normal. Lateral:  Normal.  Joint:  Large joint effusion with blood products and fat globules from the tear is and subtle fractures. Popliteal Fossa:  No Baker's cyst.  Extravasated joint fluid in the soft tissues.  Extensor Mechanism: Distal quadriceps tendon and patellar tendon are normal.  Extensive partial tear of the  medial patellofemoral ligament at the patellar attachment with a small avulsion of the cortex of the patella inferior medially. Bones: Subtle impaction fracture of the lateral aspect of the lateral femoral condyle due to the transient patellar dislocation. Small avulsion of bone from the inferior medial aspect of the patella.  These are both quite subtle injuries.  IMPRESSION:  1.  Evidence of recent transient dislocation of the patella with secondary large joint effusion and hemarthrosis. 2.  Subtle impaction fracture of the lateral aspect of the lateral femoral condyle. 3.  Partial avulsion of the medial patellofemoral ligament with a small avulsion of bone from the inferior medial aspect of patella. 4.  Large avulsed full-thickness segment of cartilage from the lateral facet of the patella.   Original Report Authenticated By: Francene Boyers, M.D.    Dg Knee Left Port  09/10/2012  *RADIOLOGY REPORT*  Clinical Data: Post reduction  PORTABLE LEFT KNEE - 1-2 VIEW  Comparison: Earlier films from today  Findings: The patella still appears to be somewhat laterally positioned and subluxation or dislocation cannot be excluded.  No acute fracture is seen.  No definite joint effusion is noted.  IMPRESSION: The patella still appears to be laterally positioned.   Original Report Authenticated By: Dwyane Dee, M.D.     EKG: Independently reviewed.  Assessment/Plan Principal Problem:  *Patellar  dislocation Active Problems:  Fever   1. Patellar dislocation - patient may WBAT per ortho, pain control and muscle spasm control with meds, PT/OT to eval re: rehab vs home, may need intervention in future as outpatient for removal of cartilage but none now. 2. Fever - ordering blood cultures, no obvious source of infection and no other sirs criteria at this point.  Recheck CBC in AM. MRI radiologist report noted significant trauma, but there was no mention of concern for septic arthritis (MRI apparently has very high sensitivity and specificity for septic arthritis), the joint is not erythematous on exam, not really any reason to have high suspicion of septic arthritis at this point, more concerning is the patients risk for development of DVT given her immobile state these past 2 days especially of the LLE which has remained immobile and extended, and her known history of being factor-V-Leiden positive, other DDX of fever include reactive to trauma / clotted hemearthrosis, and autoimmune (patient has known Lupus and RA).  Spoke with Dr. Janee Morn regarding patient, he recommended non-operative management as above and outpatient follow up.  Code Status: Full Code (must indicate code status--if unknown or must be presumed, indicate so) Family Communication: Spoke with husband at bedside (indicate person spoken with, if applicable, with phone number if by telephone) Disposition Plan: Admit to inpatient (indicate anticipated LOS)  Time spent: 70 min  Zohaib Heeney M. [facility] Pager [phone]  If 7PM-7AM, please contact night-coverage [email or web address] Password [facility] 09/11/2012, 8:38 PM

## 2012-09-12 DIAGNOSIS — M7989 Other specified soft tissue disorders: Secondary | ICD-10-CM

## 2012-09-12 DIAGNOSIS — I824Z9 Acute embolism and thrombosis of unspecified deep veins of unspecified distal lower extremity: Secondary | ICD-10-CM

## 2012-09-12 DIAGNOSIS — M79609 Pain in unspecified limb: Secondary | ICD-10-CM

## 2012-09-12 LAB — CBC
HCT: 32.6 % — ABNORMAL LOW (ref 36.0–46.0)
MCHC: 32.8 g/dL (ref 30.0–36.0)
MCV: 92.1 fL (ref 78.0–100.0)
Platelets: 120 10*3/uL — ABNORMAL LOW (ref 150–400)
RDW: 13.6 % (ref 11.5–15.5)

## 2012-09-12 LAB — URINALYSIS, ROUTINE W REFLEX MICROSCOPIC
Bilirubin Urine: NEGATIVE
Ketones, ur: 15 mg/dL — AB
Nitrite: NEGATIVE
Protein, ur: NEGATIVE mg/dL
Specific Gravity, Urine: 1.027 (ref 1.005–1.030)
Urobilinogen, UA: 1 mg/dL (ref 0.0–1.0)

## 2012-09-12 MED ORDER — CYCLOBENZAPRINE HCL 5 MG PO TABS
5.0000 mg | ORAL_TABLET | Freq: Three times a day (TID) | ORAL | Status: DC
  Administered 2012-09-12 – 2012-09-14 (×5): 5 mg via ORAL
  Filled 2012-09-12 (×7): qty 1

## 2012-09-12 MED ORDER — OXYCODONE-ACETAMINOPHEN 5-325 MG PO TABS
1.0000 | ORAL_TABLET | ORAL | Status: DC
  Administered 2012-09-12 (×2): 1 via ORAL
  Filled 2012-09-12 (×2): qty 1

## 2012-09-12 MED ORDER — OXYCODONE-ACETAMINOPHEN 5-325 MG PO TABS
1.0000 | ORAL_TABLET | ORAL | Status: DC
  Administered 2012-09-12 – 2012-09-14 (×11): 1 via ORAL
  Filled 2012-09-12 (×11): qty 1

## 2012-09-12 MED ORDER — RIVAROXABAN 15 MG PO TABS
15.0000 mg | ORAL_TABLET | Freq: Two times a day (BID) | ORAL | Status: DC
  Administered 2012-09-12 – 2012-09-14 (×4): 15 mg via ORAL
  Filled 2012-09-12 (×6): qty 1

## 2012-09-12 MED ORDER — ASPIRIN EC 81 MG PO TBEC
81.0000 mg | DELAYED_RELEASE_TABLET | Freq: Every day | ORAL | Status: DC
  Administered 2012-09-12 – 2012-09-14 (×3): 81 mg via ORAL
  Filled 2012-09-12 (×3): qty 1

## 2012-09-12 MED ORDER — SODIUM CHLORIDE 0.9 % IV SOLN: 250.0000 mL | INTRAVENOUS | Status: DC | PRN

## 2012-09-12 MED ORDER — CYCLOBENZAPRINE HCL 5 MG PO TABS
5.0000 mg | ORAL_TABLET | Freq: Three times a day (TID) | ORAL | Status: DC
  Administered 2012-09-12 (×2): 5 mg via ORAL
  Filled 2012-09-12 (×4): qty 1

## 2012-09-12 NOTE — Progress Notes (Signed)
VASCULAR LAB PRELIMINARY  PRELIMINARY  PRELIMINARY  PRELIMINARY  Bilateral lower extremity venous Dopplers completed.    Preliminary report:  There is acute, occlusive DVT noted in the left posterior tibial vein from mid to proximal calf.  All other veins appear thrombus free.  Heraclio Seidman, RVT 09/12/2012, 11:47 AM

## 2012-09-12 NOTE — Progress Notes (Signed)
PT Cancellation Note  Patient Details Name: Danielle Villegas MRN: [id number] DOB: [birth year]   Cancelled Treatment:     Noted new diagnosis of acute DVT. Will hold PT today. Will check back on tomorrow. Thanks.    Rebeca Alert [facility] 09/12/2012, 1:31 PM [phone]

## 2012-09-12 NOTE — Progress Notes (Signed)
OT Note:  Noted acute DVT.  Will see pt tomorrow.  Wilson, OTR/L [phone] 09/12/2012

## 2012-09-12 NOTE — Progress Notes (Signed)
PT NOTE  Order received. Chart reviewed. Noted pt awaiting dopplers. Will hold PT until results are documented. Will check back later as schedule permits. Thanks. Rebeca Alert, PT [phone]

## 2012-09-12 NOTE — Progress Notes (Signed)
Clinical Social Work Department CLINICAL SOCIAL WORK PLACEMENT NOTE 09/12/2012  Patient:  Danielle Villegas, Danielle Villegas  Account Number:  [id number] Admit date:  09/11/2012  Clinical Social Worker:  Leron Croak, CLINICAL SOCIAL WORKER  Date/time:  09/12/2012 12:21 PM  Clinical Social Work is seeking post-discharge placement for this patient at the following level of care:   SKILLED NURSING   (*CSW will update this form in Epic as items are completed)   09/12/2012  Patient/family provided with [facility] Department of Clinical Social Work's list of facilities offering this level of care within the geographic area requested by the patient (or if unable, by the patient's family).  09/12/2012  Patient/family informed of their freedom to choose among providers that offer the needed level of care, that participate in Medicare, Medicaid or managed care program needed by the patient, have an available bed and are willing to accept the patient.  09/12/2012  Patient/family informed of [facility]' ownership interest in [facility], as well as of the fact that they are under no obligation to receive care at this facility.  PASARR submitted to EDS on 09/12/2012 PASARR number received from [employer]DS on 09/12/2012  FL2 transmitted to all facilities in geographic area requested by pt/family on  09/12/2012 FL2 transmitted to all facilities within larger geographic area on 09/12/2012  Patient informed that his/her managed care company has contracts with or will negotiate with  certain facilities, including the following:     Patient/family informed of bed offers received:   Patient chooses bed at  Physician recommends and patient chooses bed at    Patient to be transferred to  on   Patient to be transferred to facility by   The following physician request were entered in Epic:   Additional Comments:  Leron Croak, Leeroy [facility] [employer] Week[employer] Coverage [phone]

## 2012-09-12 NOTE — Progress Notes (Signed)
Clinical Social Work Department BRIEF PSYCHOSOCIAL ASSESSMENT 09/12/2012  Patient:  Danielle Villegas, Danielle Villegas     Account Number:  [id number]     Admit date:  09/11/2012  Clinical Social Worker:  Leron Croak, CLINICAL SOCIAL WORKER  Date/Time:  09/12/2012 12:00 N  Referred by:  Physician  Date Referred:  09/12/2012 Referred for  SNF Placement   Other Referral:   Interview type:  Patient Other interview type:   Husband was also at the bedside    PSYCHOSOCIAL DATA Living Status:  FAMILY Admitted from facility:   Level of care:   Primary support name:  Waunetta Riggle Primary support relationship to patient:  SPOUSE Degree of support available:   Patient has good support from Husband and daughter: however husband works and Pt would be home without assistance during the day.    CURRENT CONCERNS Current Concerns  Post-Acute Placement   Other Concerns:    SOCIAL WORK ASSESSMENT / PLAN CSW met with the patient and husband at the bedside for d/c planning. Pt stated that she is concerned about the length of stay needed for recovery. Pt and family are not from this area and are concerned about the facilities in the area. Husband also expressed concerns about his job. Husband stated that he just moved down here for this job and he fears that missing too much work may cost him his job. Both Pt and husband would like the CSW's to work with them closely for d/c planning so that a limited amount of work is missed and husband can coordinate with his off hours. CSW received permission to send out listing to facilities in [facility].   Assessment/plan status:  Information/Referral to [facility] Other assessment/ plan:   Information/referral to community resources:   CSW provided a SNF listing for [employer] area for d/c planning for rehabiliation.    PATIENT'S/FAMILY'S RESPONSE TO PLAN OF CARE: Family and Pt were appreciative for assistance with d/c planning and SNF  placement.       Leron Croak, LCSWA [employer] Coverage [phone]

## 2012-09-12 NOTE — Progress Notes (Signed)
[facility] PROGRESS NOTE  Danielle Villegas:[id number] DOB: September 10, 1956 DOA: 09/11/2012 PCP: Loreen Freud, DO  Assessment/Plan: 1. Left knee pain, spasms--schedule Flexeril, narcotics. 2. Left knee trauma--per orthopedics non-operative, can followup as outpatient. WBAT. PT/OT consultations. 3. Acute LLE DVT--secondary to immobility from injury, complicated by factor V Leiden mutation. Discussed risk/benefits warfarin/Lovenox vs. Xarelto. Patient/husband opt for Xarelto. Discussed increased bleeding risk. 4. Fever--check urine, but no localizing symptoms, suspect secondary to DVT. No signs/symptoms to suggest sepsis. 5. Thrombocytopenia--mild, follow. 6. Normocytic anemia--mild, follow. 7. RA--continue prednisone.  Code Status: Full code Family Communication: discussed with husband at bedside Disposition Plan: pending further evaluation  Brendia Sacks, MD  [facility] Team 6 Pager [phone] If 8PM-8AM, please contact night-coverage at [email or web address], password [facility] 09/12/2012, 2:02 PM  LOS: 1 day   Brief narrative: 56 year old woman s/p fall 12/12 with left patella dislocation. Relocated in ED and sent home. Returned to ED 12/13 secondary to severe pain, spasms. In ED T 102.3 and referred for admission. Orthopedics recommended WBAT and follow-up as outpatient.  Consultants:  PT  OT  Procedures:  12/14 BLE venous doppler--acute, occlusive DVT noted in the left posterior tibial vein from mid to proximal calf. All other veins appear thrombus free.  HPI/Subjective: Severe left leg spasms, left knee pain, nausea and anorexia.   Objective: Filed Vitals:   09/11/12 2200 09/11/12 2203 09/11/12 2302 09/12/12 0555  BP: 147/81   156/81  Pulse: 97   86  Temp: 102.3 F (39.1 C) 101.8 F (38.8 C)  100.1 F (37.8 C)  TempSrc: Oral   Oral  Resp: 18   18  Height:   5\' 9"  (1.753 m)   Weight:   65.772 kg (145 lb)   SpO2: 97%   97%    Intake/Output Summary (Last  24 hours) at 09/12/12 1402 Last data filed at 09/12/12 1100  Gross per 24 hour  Intake   1323 ml  Output    200 ml  Net   1123 ml   Filed Weights   09/11/12 2302  Weight: 65.772 kg (145 lb)    Exam:   General:  Tearful, frequent spasms left leg during interview with apparent severe pain resulting Cardiovascular: RRR, no m/r/g. No LE edema. Respiratory: CTA bilaterally, no w/r/r. Normal respiratory effort. Musculoskeletal: left leg in knee immobilizer, foot appears unremarkable. Psychiatric: anxious, tearful  Data Reviewed: Basic Metabolic Panel:  Lab 09/11/12 8295  NA 140  K 3.7  CL 107  CO2 --  GLUCOSE 106*  BUN 14  CREATININE 0.80  CALCIUM --  MG --  PHOS --   CBC:  Lab 09/12/12 0457 09/11/12 1740 09/11/12 1733  WBC 4.6 -- 6.2  NEUTROABS -- -- --  HGB 10.7* 11.6* 11.3*  HCT 32.6* 34.0* 34.5*  MCV 92.1 -- 92.0  PLT 120* -- 146*   CBG:  Lab 09/12/12 0851  GLUCAP 99   Studies: Dg Knee 2 Views Left  09/11/2012  *RADIOLOGY REPORT*  Clinical Data: Knee pain and spasms.  Patellar dislocation yesterday.  LEFT KNEE - 1-2 VIEW  Comparison: 09/10/2012  Findings: Two-view exam shows no gross fracture.  No subluxation or dislocation.  There is a joint effusion.  Meniscal mineralization again noted.  IMPRESSION: No evidence for patellar dislocation.  No gross fracture.  Joint effusion.   Original Report Authenticated By: Kennith Center, M.D.    Mr Knee Left  Wo Contrast  09/11/2012  *RADIOLOGY REPORT*  Clinical Data:  Left knee pain after fall.  MRI OF THE LEFT KNEE WITHOUT CONTRAST  Technique:  Multiplanar, multisequence MR imaging of the left knee was performed.  No intravenous contrast was administered.  Comparison:  Radiographs dated 12/13 and 09/10/2012  FINDINGS: There is evidence of an acute transient lateral dislocation of the patella with a subtle impaction fracture of the lateral aspect of the lateral femoral condyle and at a tiny area of avulsion of the inferior  medial aspect of the patella as well as a partial avulsion of the medial patellofemoral ligament from the patella.                              MENISCI Medial:  Normal. Lateral:  Normal.  LIGAMENTS Cruciates:  Normal. Collaterals:  Normal.  CARTILAGE Patellofemoral:  The patient has a sheared off a large full- thickness portion of the articular cartilage of the lateral facet of the patella measuring at least 2.6 x 2.3 cm. Medial:  Normal. Lateral:  Normal.  Joint:  Large joint effusion with blood products and fat globules from the tear is and subtle fractures. Popliteal Fossa:  No Baker's cyst.  Extravasated joint fluid in the soft tissues.  Extensor Mechanism: Distal quadriceps tendon and patellar tendon are normal.  Extensive partial tear of the medial patellofemoral ligament at the patellar attachment with a small avulsion of the cortex of the patella inferior medially. Bones: Subtle impaction fracture of the lateral aspect of the lateral femoral condyle due to the transient patellar dislocation. Small avulsion of bone from the inferior medial aspect of the patella.  These are both quite subtle injuries.  IMPRESSION:  1.  Evidence of recent transient dislocation of the patella with secondary large joint effusion and hemarthrosis. 2.  Subtle impaction fracture of the lateral aspect of the lateral femoral condyle. 3.  Partial avulsion of the medial patellofemoral ligament with a small avulsion of bone from the inferior medial aspect of patella. 4.  Large avulsed full-thickness segment of cartilage from the lateral facet of the patella.   Original Report Authenticated By: Francene Boyers, M.D.     Scheduled Meds:   . aspirin EC  81 mg Oral Daily  . cyclobenzaprine  5 mg Oral TID  . multivitamin with minerals  1 tablet Oral Daily  . oxyCODONE-acetaminophen  1 tablet Oral Q4H  . predniSONE  2 mg Oral Daily  . predniSONE  5 mg Oral Daily  . rivaroxaban  15 mg Oral BID WC  . sodium chloride  3 mL Intravenous  Q12H   Continuous Infusions:   Principal Problem:  *Patellar dislocation Active Problems:  Fever  Lower leg DVT (deep venous thromboembolism), acute     Brendia Sacks, MD  [facility] Team 6 Pager [phone] If 8PM-8AM, please contact night-coverage at [email or web address], password [facility] 09/12/2012, 2:02 PM  LOS: 1 day   Time spent: 30 minutes

## 2012-09-13 DIAGNOSIS — I824Z9 Acute embolism and thrombosis of unspecified deep veins of unspecified distal lower extremity: Secondary | ICD-10-CM

## 2012-09-13 DIAGNOSIS — D696 Thrombocytopenia, unspecified: Secondary | ICD-10-CM

## 2012-09-13 DIAGNOSIS — D649 Anemia, unspecified: Secondary | ICD-10-CM

## 2012-09-13 LAB — URINE CULTURE

## 2012-09-13 LAB — CBC
HCT: 33.1 % — ABNORMAL LOW (ref 36.0–46.0)
Hemoglobin: 11.2 g/dL — ABNORMAL LOW (ref 12.0–15.0)
RBC: 3.6 MIL/uL — ABNORMAL LOW (ref 3.87–5.11)
WBC: 5.3 10*3/uL (ref 4.0–10.5)

## 2012-09-13 LAB — GLUCOSE, CAPILLARY: Glucose-Capillary: 111 mg/dL — ABNORMAL HIGH (ref 70–99)

## 2012-09-13 MED ORDER — SODIUM CHLORIDE 0.9 % IV SOLN: 250.0000 mL | INTRAVENOUS | Status: DC | PRN

## 2012-09-13 NOTE — Progress Notes (Signed)
Pt ok to work with physical therapy this morning per Dr. Irene Limbo.

## 2012-09-13 NOTE — Evaluation (Signed)
Occupational Therapy Evaluation Patient Details Name: Danielle Villegas MRN: [id number] DOB: 25-Feb-1956 Today's Date: 09/13/2012 Time: 7829-5621 OT Time Calculation (min): 40 min  OT Assessment / Plan / Recommendation Clinical Impression  56 yo female s/p LT knee patella subluxation with pending outpatient follow up appointment for surgerical needs. Pt could benefit from skilled OT acutely. REcommend SNF for d/c planning due to 20 stairs to enter apartment and limited (A) from husband at d/c. Pt has poor flexibility from job to take time off.    OT Assessment  Patient needs continued OT Services    Follow Up Recommendations  SNF    Barriers to Discharge      Equipment Recommendations  3 in 1 bedside comode;Other (comment) (RW)    Recommendations for Other Services    Frequency  Min 2X/week    Precautions / Restrictions Precautions Precautions: Knee Required Braces or Orthoses: Knee Immobilizer - Left Restrictions LLE Weight Bearing: Weight bearing as tolerated   Pertinent Vitals/Pain 4 out 10  Only one muscle spasm at the beginning of session V/c for pursed lip breathing during mobility Pt slight anxious initially due to severe pain 09/12/12 and release from [facility] now with + DVT    ADL  Eating/Feeding: Modified independent Where Assessed - Eating/Feeding: Chair Lower Body Dressing: Moderate assistance (able to don Rt sock) Where Assessed - Lower Body Dressing: Supine, head of bed up Toilet Transfer: Moderate assistance Toilet Transfer Method: Sit to stand Toilet Transfer Equipment: Raised toilet seat with arms (or 3-in-1 over toilet) Toileting - Clothing Manipulation and Hygiene: Minimal assistance Where Assessed - Toileting Clothing Manipulation and Hygiene: Sit to stand from 3-in-1 or toilet Equipment Used: Knee Immobilizer;Gait belt;Rolling walker Transfers/Ambulation Related to ADLs: Pt progressing with RW use and min v/c for sequence. PT with good return demo after  education. Pt states "I am walking, I couldn't do this yesterday" ADL Comments: Pt educated on bed mobility, KI positioning and need for continued therapy. PT is very concerned with husband (A)ing due to multiple medication problems (back, recent PNA and cardiac issues)     OT Diagnosis: Acute pain  OT Problem List: Decreased strength;Decreased activity tolerance;Impaired balance (sitting and/or standing);Decreased safety awareness;Decreased knowledge of use of DME or AE;Decreased knowledge of precautions;Pain OT Treatment Interventions: Self-care/ADL training;DME and/or AE instruction;Therapeutic activities;Patient/family education;Balance training   OT Goals Acute Rehab OT Goals OT Goal Formulation: With patient/family Time For Goal Achievement: 09/27/12 Potential to Achieve Goals: Good ADL Goals Pt Will Perform Lower Body Dressing: with modified independence;Sit to stand from chair;with adaptive equipment ADL Goal: Lower Body Dressing - Progress: Goal set today Pt Will Transfer to Toilet: with modified independence;3-in-1;Ambulation Pt Will Perform Tub/Shower Transfer: with supervision;Ambulation;with DME ADL Goal: Tub/Shower Transfer - Progress: Goal set today Miscellaneous OT Goals Miscellaneous OT Goal #1: pt will complete bed mobility HOB <20 degrees no bed rails MOD I as precursor to adls OT Goal: Miscellaneous Goal #1 - Progress: Goal set today  Visit Information  Last OT Received On: 09/13/12 Assistance Needed: +1 PT/OT Co-Evaluation/Treatment: Yes    Subjective Data  Subjective: "they just sent me home with crutches, we didn't know what to do. "- pt and husband verbalize feeling upset from being released from [facility] and the lack of information about crutch use prior to d/c home Patient Stated Goal: to go to rehab center then home   Prior Functioning     Home Living Lives With: Spouse Available Help at Discharge: Family Type of Home: Apartment (second  floor) Home Access:  Stairs to enter Entrance Stairs-Number of Steps: 20 Entrance Stairs-Rails: Can reach both;Left;Right Home Layout: One level Bathroom Shower/Tub: Engineer, manufacturing systems: Standard Home Adaptive Equipment: Environmental consultant - rolling;Crutches Prior Function Level of Independence: Independent Able to Take Stairs?: Yes Driving: Yes Communication Communication: No difficulties Dominant Hand: Right         Vision/Perception     Cognition  Overall Cognitive Status: Appears within functional limits for tasks assessed/performed Arousal/Alertness: Awake/alert Orientation Level: Appears intact for tasks assessed Behavior During Session: [facility] for tasks performed    Extremity/Trunk Assessment Right Upper Extremity Assessment RUE ROM/Strength/Tone: Within functional levels Left Upper Extremity Assessment LUE ROM/Strength/Tone: Within functional levels Right Lower Extremity Assessment RLE ROM/Strength/Tone: Within functional levels Left Lower Extremity Assessment LLE ROM/Strength/Tone: Deficits;Due to pain LLE ROM/Strength/Tone Deficits: hip flexion 2/5, ankle at least 3/5, knee NT- pt unable to tolerate flexion ROM of knee 2* pain LLE Sensation: WFL - Light Touch Trunk Assessment Trunk Assessment: Normal     Mobility Bed Mobility Bed Mobility: Supine to Sit;Sitting - Scoot to Edge of Bed Supine to Sit: 3: Mod assist;HOB elevated (pulling up on therapist and needed support of LT LE) Sitting - Scoot to Edge of Bed: 3: Mod assist Details for Bed Mobility Assistance: Pt educated on hand placement on KI to help advance Lt toward eob with (A) . Pt tolerating well. Pt anxious at first and with rapport build progressing using Rt LE to lift buttock off bed surface. Transfers Transfers: Sit to Stand;Stand to Sit Sit to Stand: 3: Mod assist;With upper extremity assist;From bed Stand to Sit: With upper extremity assist;To chair/3-in-1;4: Min assist ((A) to Lt LE ) Details for Transfer  Assistance: educated on hand placement     Shoulder Instructions     Exercise     Balance     End of Session OT - End of Session Activity Tolerance: Patient tolerated treatment well Patient left: in chair;with call bell/phone within reach Nurse Communication: Mobility status;Precautions  GO Functional Assessment Tool Used: clinical judgement Functional Limitation: Self care Self Care Current Status (J4782): At least 1 percent but less than 20 percent impaired, limited or restricted Self Care Goal Status (N5621): At least 1 percent but less than 20 percent impaired, limited or restricted   Lucile Shutters 09/13/2012, 10:00 AM  Pager: [phone]

## 2012-09-13 NOTE — Care Management Note (Signed)
    Page 1 of 1   09/14/2012     6:03:01 PM   CARE MANAGEMENT NOTE 09/14/2012  Patient:  Danielle Villegas, Danielle Villegas   Account Number:  [id number]  Date Initiated:  09/13/2012  Documentation initiated by:  [facility]  Subjective/Objective Assessment:   admitted w/L patellar dislocation, l leg dvt.     Action/Plan:   From home w/spouse.Has multiple stairs to negotiate.   Anticipated DC Date:  09/14/2012   Anticipated DC Plan:  SKILLED NURSING FACILITY      DC Planning Services  CM consult      Choice offered to / List presented to:  C-1 Patient   DME arranged  NA      DME agency  NA     HH arranged  NA      HH agency  NA   Status of service:  Completed, signed off Medicare Important Message given?  NO (If response is "NO", the following Medicare IM given date fields will be blank) Date Medicare IM given:   Date Additional Medicare IM given:    Discharge Disposition:  SKILLED NURSING FACILITY  Per UR Regulation:    If discussed at Long Length of Stay Meetings, dates discussed:    Comments:  09/13/12 KATHY MAHABIR RN,BSN NCM [phone] PT-snf.CSW already following.Provided patient w/HHC agency list/private sitter list as a resource.

## 2012-09-13 NOTE — Progress Notes (Signed)
Chaplain Note: Paged at 3:37 by the patient's nurse on Friday, 09/12/12, actually for the patient's husband.  Arriving at 4:10pm after some phone time with the nurse, she went and got the husband out of the room (he did not want to talk in the room).  His wife has developed a clot after taking a fall, which busted her femur, dislocated her knee and tore some ligaments or tendons. The patient has some condition where she must be very careful about blood clots forming and increases her propensity for clots.  The bands main issue was his fear.  He is re-living what occurred 17 years ago when his wife almost died. He is very much fearful of the possibility of losing his wife and what would he do. They have a 56 year old daughter, but he has no other relatives.  Neither he nor his wife Dispensing optician) have attended [employer] or church in a long while.    Chaplain affirmed his feelings as real, and that feelings are most often real. He needn't feel selfish about how he feels.  He had good reason to be scared.  He felt badly because he was angry at God.  So we talked about Bronson Ing and Job, not only as examples, but also part of his Jewish background.  We also drew from the positives that were "signs" of God's presence.  While his daughter lost her job, it was about a day before her mom came to the hospital, freeing her up to assist without time constraint, allowing him to attend to his job (another stressor for him).  We discussed the odds of having a Chaplain come talk to him who was also raised Jewish.  We discussed his wife's faith (which he said was strong).  We discussed the fact that his wife and the Chaplain were telling him the same thing (an affirmation of what he was hearing).  We also discussed and stressed very heavily the fact that he's been here before and has lived (and his wife) 17 years since.  He is able to come through this because he already has.  Conversation went along these lines, and he was  very receptive.  Rema Jasmine, Chaplain Pager: [phone]

## 2012-09-13 NOTE — Progress Notes (Signed)
[facility] PROGRESS NOTE  Danielle Villegas UJW:[id number] DOB: 1956/02/23 DOA: 09/11/2012 PCP: Loreen Freud, DO  Assessment/Plan: 1. Left knee pain, spasms--controlled with scheduled Flexeril, narcotics. 2. Left knee trauma--per orthopedics non-operative, can followup as outpatient. WBAT. PT/OT consultations noted. 3. Acute LLE DVT--secondary to immobility from injury, complicated by factor V Leiden mutation. Discussed risk/benefits warfarin/Lovenox vs. Xarelto. Patient/husband opt for Xarelto. Discussed increased bleeding risk. 4. Fever--resolved, secondary to DVT. 5. Thrombocytopenia--stable, follow. 6. Normocytic anemia--stable, follow-up as outpatient. 7. RA--continue prednisone.  Again discussed with patient and husband at bedside. Clinically much improved. Discussed implications of observation vs. Inpatient status and current observation status. Pain management predominantly oral, knee trauma non-operative. Have discussed with Olegario Messier with case management, requested UR and CM to discuss with family. Patient and husband know that discharge planned 12/16. Difficult situation, 19 steps to get to apartment, no elevator.  Code Status: Full code Family Communication: discussed with husband at bedside as above Disposition Plan: pending further evaluation  Brendia Sacks, MD  [facility] Team 6 Pager [phone] If 8PM-8AM, please contact night-coverage at [email or web address], password [facility] 09/13/2012, 11:26 AM  LOS: 2 days   Brief narrative: 56 year old woman s/p fall 12/12 with left patella dislocation. Relocated in ED and sent home. Returned to ED 12/13 secondary to severe pain, spasms. In ED T 102.3 and referred for admission. Orthopedics recommended WBAT and follow-up as outpatient.  Consultants:  PT  OT  Procedures:  12/14 BLE venous doppler--acute, occlusive DVT noted in the left posterior tibial vein from mid to proximal calf. All other veins appear thrombus  free.  HPI/Subjective: Much better. Spasms and pain controlled with current regimen. Was able to work with PT and OT this AM. Appetite improved, eating.   Objective: Filed Vitals:   09/12/12 0555 09/12/12 1409 09/12/12 2240 09/13/12 0442  BP: 156/81 139/83 131/86 155/78  Pulse: 86 99 99 88  Temp: 100.1 F (37.8 C) 100.1 F (37.8 C) 99.8 F (37.7 C) 98.9 F (37.2 C)  TempSrc: Oral Oral Oral Oral  Resp: 18 17 18 18   Height:      Weight:      SpO2: 97% 99% 98% 100%    Intake/Output Summary (Last 24 hours) at 09/13/12 1126 Last data filed at 09/13/12 0134  Gross per 24 hour  Intake    560 ml  Output    750 ml  Net   -190 ml   Filed Weights   09/11/12 2302  Weight: 65.772 kg (145 lb)    Exam:   General:  Appears calm and comfortable sitting in chair Cardiovascular: RRR, no m/r/g.  Respiratory: CTA bilaterally, no w/r/r. Normal respiratory effort. Musculoskeletal: left leg in knee immobilizer, foot appears unremarkable, warm, dry Psychiatric: grossly normal mood and affect, speech fluent and appropriate  Data Reviewed: Basic Metabolic Panel:  Lab 09/11/12 6578  NA 140  K 3.7  CL 107  CO2 --  GLUCOSE 106*  BUN 14  CREATININE 0.80  CALCIUM --  MG --  PHOS --   CBC:  Lab 09/13/12 0515 09/12/12 0457 09/11/12 1740 09/11/12 1733  WBC 5.3 4.6 -- 6.2  NEUTROABS -- -- -- --  HGB 11.2* 10.7* 11.6* 11.3*  HCT 33.1* 32.6* 34.0* 34.5*  MCV 91.9 92.1 -- 92.0  PLT 128* 120* -- 146*   CBG:  Lab 09/13/12 0845 09/12/12 0851  GLUCAP 111* 99   Studies: Dg Knee 2 Views Left  09/11/2012  *RADIOLOGY REPORT*  Clinical Data: Knee pain and spasms.  Patellar  dislocation yesterday.  LEFT KNEE - 1-2 VIEW  Comparison: 09/10/2012  Findings: Two-view exam shows no gross fracture.  No subluxation or dislocation.  There is a joint effusion.  Meniscal mineralization again noted.  IMPRESSION: No evidence for patellar dislocation.  No gross fracture.  Joint effusion.   Original  Report Authenticated By: Kennith Center, M.D.    Mr Knee Left  Wo Contrast  09/11/2012  *RADIOLOGY REPORT*  Clinical Data:  Left knee pain after fall.  MRI OF THE LEFT KNEE WITHOUT CONTRAST  Technique:  Multiplanar, multisequence MR imaging of the left knee was performed.  No intravenous contrast was administered.  Comparison:  Radiographs dated 12/13 and 09/10/2012  FINDINGS: There is evidence of an acute transient lateral dislocation of the patella with a subtle impaction fracture of the lateral aspect of the lateral femoral condyle and at a tiny area of avulsion of the inferior medial aspect of the patella as well as a partial avulsion of the medial patellofemoral ligament from the patella.                              MENISCI Medial:  Normal. Lateral:  Normal.  LIGAMENTS Cruciates:  Normal. Collaterals:  Normal.  CARTILAGE Patellofemoral:  The patient has a sheared off a large full- thickness portion of the articular cartilage of the lateral facet of the patella measuring at least 2.6 x 2.3 cm. Medial:  Normal. Lateral:  Normal.  Joint:  Large joint effusion with blood products and fat globules from the tear is and subtle fractures. Popliteal Fossa:  No Baker's cyst.  Extravasated joint fluid in the soft tissues.  Extensor Mechanism: Distal quadriceps tendon and patellar tendon are normal.  Extensive partial tear of the medial patellofemoral ligament at the patellar attachment with a small avulsion of the cortex of the patella inferior medially. Bones: Subtle impaction fracture of the lateral aspect of the lateral femoral condyle due to the transient patellar dislocation. Small avulsion of bone from the inferior medial aspect of the patella.  These are both quite subtle injuries.  IMPRESSION:  1.  Evidence of recent transient dislocation of the patella with secondary large joint effusion and hemarthrosis. 2.  Subtle impaction fracture of the lateral aspect of the lateral femoral condyle. 3.  Partial avulsion  of the medial patellofemoral ligament with a small avulsion of bone from the inferior medial aspect of patella. 4.  Large avulsed full-thickness segment of cartilage from the lateral facet of the patella.   Original Report Authenticated By: Francene Boyers, M.D.     Scheduled Meds:    . aspirin EC  81 mg Oral Daily  . cyclobenzaprine  5 mg Oral TID  . multivitamin with minerals  1 tablet Oral Daily  . oxyCODONE-acetaminophen  1 tablet Oral Q4H  . predniSONE  2 mg Oral Daily  . predniSONE  5 mg Oral Daily  . rivaroxaban  15 mg Oral BID WC  . sodium chloride  3 mL Intravenous Q12H   Continuous Infusions:   Principal Problem:  *Patellar dislocation Active Problems:  Fever  Lower leg DVT (deep venous thromboembolism), acute  Thrombocytopenia  Anemia     Brendia Sacks, MD  [facility] Team 6 Pager [phone] If 8PM-8AM, please contact night-coverage at [email or web address], password [facility] 09/13/2012, 11:26 AM  LOS: 2 days   Time spent: 20 minutes

## 2012-09-13 NOTE — Evaluation (Signed)
Physical Therapy Evaluation Patient Details Name: Danielle Villegas MRN: [id number] DOB: [birth year] Today's Date: 09/13/2012 Time: 7829-5621 PT Time Calculation (min): 40 min  PT Assessment / Plan / Recommendation Clinical Impression  56 y.o. female with h/o RA, fibromyalgia, lupus, subluxing L patella admitted after L patella dislocation. Pt had severe pain and muscle spasms, found to have DVT.  Today pt ambulated 24' with RW with min A. REcommend ST-SNF 2* husband works and pt has 22 steps to enter apartment. She would benefit from acute PT to maximize safety and independence with mobility.     PT Assessment  Patient needs continued PT services    Follow Up Recommendations  SNF;Supervision for mobility/OOB;Supervision/Assistance - 24 hour    Does the patient have the potential to tolerate intense rehabilitation      Barriers to Discharge Inaccessible home environment;Decreased caregiver support 22 steps to enter, husband works    Furniture conservator/restorer  None recommended by PT    Recommendations for [employer] OT consult   Frequency Min 5X/week    Precautions / Restrictions Precautions Precautions: Knee Required Braces or Orthoses: Knee Immobilizer - Left Restrictions LLE Weight Bearing: Weight bearing as tolerated   Pertinent Vitals/Pain *5/10 L knee Premedicated, ice applied**      Mobility  Bed Mobility Bed Mobility: Supine to Sit Supine to Sit: 3: Mod assist Sitting - Scoot to Edge of Bed: 3: Mod assist Details for Bed Mobility Assistance: assist to support LLE, scoot hips to EOB Transfers Transfers: Sit to Stand;Stand to Sit Sit to Stand: 3: Mod assist;From bed Stand to Sit: With armrests;To chair/3-in-1;4: Min assist Details for Transfer Assistance: min A to support LLE, VCs hand placement Ambulation/Gait Ambulation/Gait Assistance: 4: Min assist Ambulation Distance (Feet): 12 Feet Assistive device: Rolling walker Gait Pattern: Step-to  pattern General Gait Details: VCs for sequencing and positioning in RW, pt wore L KI    Shoulder Instructions     Exercises General Exercises - Lower Extremity Ankle Circles/Pumps: AROM;Both;10 reps;Seated   PT Diagnosis: Difficulty walking;Acute pain  PT Problem List: Decreased strength;Decreased activity tolerance;Decreased range of motion;Decreased mobility;Pain;Decreased knowledge of use of DME PT Treatment Interventions: Gait training;Stair training;Therapeutic activities;Therapeutic exercise;Patient/family education   PT Goals Acute Rehab PT Goals PT Goal Formulation: With patient/family Time For Goal Achievement: 09/20/12 Potential to Achieve Goals: Good Pt will go Supine/Side to Sit: with modified independence PT Goal: Supine/Side to Sit - Progress: Goal set today Pt will go Sit to Stand: with supervision PT Goal: Sit to Stand - Progress: Goal set today Pt will Ambulate: 51 - 150 feet;with supervision;with rolling walker PT Goal: Ambulate - Progress: Goal set today Pt will Go Up / Down Stairs: 3-5 stairs;with min assist PT Goal: Up/Down Stairs - Progress: Goal set today  Visit Information  Last PT Received On: 09/13/12 Assistance Needed: +1    Subjective Data  Subjective: I was sent home with crutches from the ER but nobody showed me how to use them.  Patient Stated Goal: be able to get up flight of stairs to apt   Prior Functioning  Home Living Lives With: Spouse Available Help at Discharge: Family Type of Home: Apartment (second floor) Home Access: Stairs to enter Entrance Stairs-Number of Steps: 20 Entrance Stairs-Rails: Can reach both;Left;Right Home Layout: One level Bathroom Shower/Tub: Engineer, manufacturing systems: Standard Home Adaptive Equipment: Environmental consultant - rolling;Crutches Prior Function Level of Independence: Independent Able to Take Stairs?: Yes Driving: Yes Communication Communication: No difficulties Dominant Hand: Right  Cognition   Overall Cognitive Status: Appears within functional limits for tasks assessed/performed Arousal/Alertness: Awake/alert Orientation Level: Appears intact for tasks assessed Behavior During Session: [facility] for tasks performed    Extremity/Trunk Assessment Right Upper Extremity Assessment RUE ROM/Strength/Tone: Within functional levels Left Upper Extremity Assessment LUE ROM/Strength/Tone: Within functional levels Right Lower Extremity Assessment RLE ROM/Strength/Tone: Within functional levels Left Lower Extremity Assessment LLE ROM/Strength/Tone: Deficits;Due to pain LLE ROM/Strength/Tone Deficits: hip flexion 2/5, ankle at least 3/5, knee NT- pt unable to tolerate flexion ROM of knee 2* pain LLE Sensation: WFL - Light Touch Trunk Assessment Trunk Assessment: Normal   Balance Balance Balance Assessed: Yes Static Sitting Balance Static Sitting - Balance Support: Bilateral upper extremity supported;Feet supported Static Sitting - Level of Assistance: 7: Independent Static Sitting - Comment/# of Minutes: 4  End of Session PT - End of Session Equipment Utilized During Treatment: Gait belt;Left knee immobilizer Activity Tolerance: Patient tolerated treatment well Patient left: in chair;with call bell/phone within reach;with family/visitor present Nurse Communication: Mobility status  GP Functional Assessment Tool Used: clinical judgement Functional Limitation: Mobility: Walking and moving around Mobility: Walking and Moving Around Current Status (Z6109): At least 60 percent but less than 80 percent impaired, limited or restricted Mobility: Walking and Moving Around Goal Status [phone]): At least 20 percent but less than 40 percent impaired, limited or restricted   Tamala Ser 09/13/2012, 10:10 AM  [phone]

## 2012-09-14 DIAGNOSIS — D696 Thrombocytopenia, unspecified: Secondary | ICD-10-CM

## 2012-09-14 LAB — GLUCOSE, CAPILLARY: Glucose-Capillary: 97 mg/dL (ref 70–99)

## 2012-09-14 MED ORDER — CYCLOBENZAPRINE HCL 5 MG PO TABS: 5.0000 mg | ORAL_TABLET | Freq: Three times a day (TID) | ORAL | Status: DC

## 2012-09-14 MED ORDER — RIVAROXABAN 20 MG PO TABS: 20.0000 mg | ORAL_TABLET | Freq: Every day | ORAL | Status: DC

## 2012-09-14 MED ORDER — RIVAROXABAN 15 MG PO TABS: 15.0000 mg | ORAL_TABLET | Freq: Two times a day (BID) | ORAL | Status: DC

## 2012-09-14 MED ORDER — OXYCODONE-ACETAMINOPHEN 5-325 MG PO TABS: 1.0000 | ORAL_TABLET | ORAL | Status: DC

## 2012-09-14 NOTE — Progress Notes (Signed)
[facility] PROGRESS NOTE  Danielle Villegas ZOX:[id number] DOB: 09-10-1956 DOA: 09/11/2012 PCP: Loreen Freud, DO  Assessment/Plan: 1. Left knee pain, spasms--controlled with scheduled Flexeril, narcotics. 2. Left knee trauma--per orthopedics non-operative. Discussed with Dr. Janee Morn, can follow-up with Dr. Ave Filter later this week, keep knee immobilizer on at all times, no knee ROM. WBAT. 3. Acute LLE DVT--secondary to immobility from injury, complicated by factor V Leiden mutation. Discussed risk/benefits warfarin/Lovenox vs. Xarelto. Patient/husband opt for Xarelto. Discussed increased bleeding risk. 4. Fever--resolved, secondary to DVT. 5. Thrombocytopenia--stable, follow. 6. Normocytic anemia--stable, follow-up as outpatient. 7. RA--continue prednisone.  Code Status: Full code Family Communication: discussed with husband at bedside Disposition Plan: SNF  Brendia Sacks, MD  [facility] Team 6 Pager [phone] If 8PM-8AM, please contact night-coverage at [email or web address], password [facility] 09/14/2012, 2:44 PM  LOS: 3 days   Brief narrative: 56 year old woman s/p fall 12/12 with left patella dislocation. Relocated in ED and sent home. Returned to ED 12/13 secondary to severe pain, spasms. In ED T 102.3 and referred for admission. Orthopedics recommended WBAT and follow-up as outpatient.  Consultants:  PT  OT  Procedures:  12/14 BLE venous doppler--acute, occlusive DVT noted in the left posterior tibial vein from mid to proximal calf. All other veins appear thrombus free.  HPI/Subjective: Doing well, better with therapy today, spasms much decreased. Still some pain.   Objective: Filed Vitals:   09/13/12 0442 09/13/12 1440 09/13/12 2146 09/14/12 0410  BP: 155/78 147/87 135/83 139/84  Pulse: 88 96 90 92  Temp: 98.9 F (37.2 C) 100.1 F (37.8 C) 98.7 F (37.1 C) 99.1 F (37.3 C)  TempSrc: Oral Oral Oral Oral  Resp: 18 16 18 16   Height:      Weight:       SpO2: 100% 97% 99% 98%    Intake/Output Summary (Last 24 hours) at 09/14/12 1444 Last data filed at 09/14/12 1000  Gross per 24 hour  Intake    360 ml  Output   1000 ml  Net   -640 ml   Filed Weights   09/11/12 2302  Weight: 65.772 kg (145 lb)    Exam:   General:  Appears calm and comfortable  Respiratory: Normal respiratory effort. Musculoskeletal: left leg in knee immobilizer, foot appears unremarkable, warm, dry Psychiatric: grossly normal mood and affect, speech fluent and appropriate  Data Reviewed: Basic Metabolic Panel:  Lab 09/11/12 8295  NA 140  K 3.7  CL 107  CO2 --  GLUCOSE 106*  BUN 14  CREATININE 0.80  CALCIUM --  MG --  PHOS --   CBC:  Lab 09/13/12 0515 09/12/12 0457 09/11/12 1740 09/11/12 1733  WBC 5.3 4.6 -- 6.2  NEUTROABS -- -- -- --  HGB 11.2* 10.7* 11.6* 11.3*  HCT 33.1* 32.6* 34.0* 34.5*  MCV 91.9 92.1 -- 92.0  PLT 128* 120* -- 146*   CBG:  Lab 09/14/12 0807 09/13/12 0845 09/12/12 0851  GLUCAP 97 111* 99   Studies: No results found.  Scheduled Meds:    . aspirin EC  81 mg Oral Daily  . cyclobenzaprine  5 mg Oral TID  . multivitamin with minerals  1 tablet Oral Daily  . oxyCODONE-acetaminophen  1 tablet Oral Q4H  . predniSONE  2 mg Oral Daily  . predniSONE  5 mg Oral Daily  . rivaroxaban  15 mg Oral BID WC  . sodium chloride  3 mL Intravenous Q12H   Continuous Infusions:   Principal Problem:  *Patellar dislocation Active Problems:  Fever  Lower leg DVT (deep venous thromboembolism), acute  Thrombocytopenia  Anemia     Brendia Sacks, MD  [facility] Team 6 Pager [phone] If 8PM-8AM, please contact night-coverage at [email or web address], password [facility] 09/14/2012, 2:44 PM  LOS: 3 days

## 2012-09-14 NOTE — Progress Notes (Signed)
Physical Therapy Treatment Patient Details Name: Danielle Villegas MRN: [id number] DOB: 1956-04-07 Today's Date: 09/14/2012 Time: 1200-1310 PT Time Calculation (min): 70 min  PT Assessment / Plan / Recommendation Comments on Treatment Session  Pt progressing slowly and requires assist with amb/transfers. Per phone conversation with Dr Mack Hook @ 13:10pm, KI must be worn at all times, no knee ROM and f/u in 2 weeks.  Pt is unsteady and considered a HIGH FALL RISK.    Follow Up Recommendations  SNF     Does the patient have the potential to tolerate intense rehabilitation     Barriers to Discharge        Equipment Recommendations  None recommended by PT    Recommendations for Other Services    Frequency Min 5X/week   Plan Discharge plan remains appropriate    Precautions / Restrictions Precautions Precautions: Fall;Knee Precaution Comments: No knee ROM per Dr Mack Hook (Ortho MD) Required Braces or Orthoses: Knee Immobilizer - Left Knee Immobilizer - Left: On at all times Restrictions Weight Bearing Restrictions: No LLE Weight Bearing: Weight bearing as tolerated Other Position/Activity Restrictions: WBAT with KI    Pertinent Vitals/Pain C/o 6/10 L knee pain during gait ICE applied    Mobility  Bed Mobility Bed Mobility: Supine to Sit;Sit to Supine Supine to Sit: 3: Mod assist Sitting - Scoot to Edge of Bed: 3: Mod assist Details for Bed Mobility Assistance: increased support for L LE to minimize pain Transfers Transfers: Sit to Stand;Stand to Sit Sit to Stand: 3: Mod assist;4: Min assist;From toilet;From bed Stand to Sit: 3: Mod assist;4: Min assist;To toilet;To bed Details for Transfer Assistance: 50% VC's on proper technique, hand placement and safety with turns. Ambulation/Gait Ambulation/Gait Assistance: 4: Min assist Ambulation Distance (Feet): 36 Feet Assistive device: Rolling walker Ambulation/Gait Assistance Details: 50% VC's on proper  sequencing and proper walker to self distance to increase BOS and safety.  Very slow gait with max c/o fatigue.  Unsteady gait with mild tremors throughout.  HIGH FALL RISK. Gait Pattern: Step-to pattern;Decreased stance time - left;Trunk flexed;Decreased stride length Gait velocity: very slow gait    Exercises Total Joint Exercises Ankle Circles/Pumps: AROM;Both;10 reps;Supine Quad Sets: AROM;Both;10 reps;Supine Gluteal Sets: AROM;Both;10 reps;Supine   PT Goals    Visit Information  Last PT Received On: 09/14/12 Assistance Needed: +1    Subjective Data      Cognition       Balance     End of Session PT - End of Session Equipment Utilized During Treatment: Gait belt;Left knee immobilizer (KI on all times. no knee ROM)   Felecia Shelling  PTA [facility]  Acute  Rehab Pager     [phone]

## 2012-09-14 NOTE — Progress Notes (Signed)
[employer] has provided prior authorization for ST SNF placement at [facility]. SNF bed available today if pt is ready for d/c. Cori Razor LCSW

## 2012-09-14 NOTE — Progress Notes (Signed)
FL2 in chart for MD signature. Pt / spouse have chosen [facility] for ST SNF placement. Clinical info has been sent to [facility] to begin prior authorization process with [employer]. CSW will follow to assist with d/c planning to SNF.  Cori Razor LCSW [phone]

## 2012-09-14 NOTE — Discharge Summary (Addendum)
Physician Discharge Summary  Danielle Villegas:[id number] DOB: 12/18/1955 DOA: 09/11/2012  PCP: Loreen Freud, DO Orthopedics: Jones Broom, MD  Admit date: 09/11/2012 Discharge date: 09/14/2012  Recommendations for Outpatient Follow-up:  1. SNF for short term rehab 2. Activity per orthopedics--keep knee immobilizer on at all times, no knee ROM. WBAT. 3. Note dosage and schedule change on Xarelto to occur 09/2012 4. Follow-up DVT provoked by knee trauma 5. Follow-up mild anemia/thrombocytopenia, consider repeat CBC 12/17 or 12/18   Follow-up Information    Follow up with Loreen Freud, DO. In 3 weeks.   Contact information:   [address] [facility] [address] [place] [address] [phone]       Schedule an appointment as soon as possible for a visit with Mable Paris, MD. (4-5 days)    Contact information:   [address] [place] [address] [phone]         Discharge Diagnoses:  1. Left knee pain, spasms 2. Left knee trauma 3. Acute LLE DVT 4. Fever  5. Thrombocytopenia 6. Normocytic anemia  Discharge Condition: improved Disposition: SNF for short term rehabilition  Diet recommendation: regular  Filed Weights   09/11/12 2302  Weight: 65.772 kg (145 lb)    History of present illness:  56 year old woman s/p fall 12/12 with left patella dislocation. Relocated in ED and sent home. Returned to ED 12/13 secondary to severe pain, spasms. In ED T 102.3 and referred for admission.   Hospital Course:  Ms. Gadea was admitted for further evaluation of fever. Venous doppler revealed acute DVT and after discussion of risk/benefit patient decided on Xarelto for treatment. Fever resolved and infectious workup was negative. Intense knee pain and leg spasms well controlled by scheduled Flexeril and Lortab. Discussed with Dr. Janee Morn who recommended activity as below. Already improving with therapy.  1. Left knee pain,  spasms--controlled with scheduled Flexeril, narcotics.  2. Left knee trauma--per orthopedics non-operative. Discussed with Dr. Janee Morn, can follow-up with Dr. Ave Filter later this week, keep knee immobilizer on at all times, no knee ROM. WBAT.  3. Acute LLE DVT--secondary to immobility from injury, complicated by factor V Leiden mutation. Discussed risk/benefits warfarin/Lovenox vs. Xarelto. Patient/husband opt for Xarelto. Discussed increased bleeding risk.  4. Fever--resolved, secondary to DVT.  5. Thrombocytopenia--stable, follow.  6. Normocytic anemia--stable, follow-up as outpatient.  7. RA--continue prednisone.  Consultants:  PT   OT  Procedures:  12/14 BLE venous doppler--acute, occlusive DVT noted in the left posterior tibial vein from mid to proximal calf. All other veins appear thrombus free.  Discharge Instructions  Discharge Orders    Future Orders Please Complete By Expires   Diet general      Increase activity slowly      Discharge instructions      Comments:   Call physician or seek immediate medical attention for increased pain, fever, shortness of breath or chest pain. Keep knee immobilizer on at all times, no knee bending.       Medication List     As of 09/14/2012  2:56 PM    STOP taking these medications         aspirin EC 81 MG tablet      TAKE these medications         cyclobenzaprine 5 MG tablet   Commonly known as: FLEXERIL   Take 1 tablet (5 mg total) by mouth 3 (three) times daily.      LUTEIN PO   Take 1 tablet by  mouth daily.      multivitamin with minerals Tabs   Take 1 tablet by mouth daily.      oxyCODONE-acetaminophen 5-325 MG per tablet   Commonly known as: PERCOCET/ROXICET   Take 1 tablet by mouth every 4 (four) hours. While awake. Hold for sedation.      predniSONE 1 MG tablet   Commonly known as: DELTASONE   Take 2 mg by mouth daily. Take with 5 mg every day      predniSONE 5 MG tablet   Commonly known as: DELTASONE    Take 5 mg by mouth daily. Take with 2 mg every day      Rivaroxaban 20 MG Tabs   Commonly known as: XARELTO   Take 1 tablet (20 mg total) by mouth daily with supper. Start January 5th 2014 in the evening.      Rivaroxaban 15 MG Tabs tablet   Commonly known as: XARELTO   Take 1 tablet (15 mg total) by mouth 2 (two) times daily with a meal. Continue through 10/03/2012, then 20mg  po daily.        The results of significant diagnostics from this hospitalization (including imaging, microbiology, ancillary and laboratory) are listed below for reference.    Significant Diagnostic Studies: Dg Knee 2 Views Left  09/11/2012  *RADIOLOGY REPORT*  Clinical Data: Knee pain and spasms.  Patellar dislocation yesterday.  LEFT KNEE - 1-2 VIEW  Comparison: 09/10/2012  Findings: Two-view exam shows no gross fracture.  No subluxation or dislocation.  There is a joint effusion.  Meniscal mineralization again noted.  IMPRESSION: No evidence for patellar dislocation.  No gross fracture.  Joint effusion.   Original Report Authenticated By: Kennith Center, M.D.    Mr Knee Left  Wo Contrast  09/11/2012  *RADIOLOGY REPORT*  Clinical Data:  Left knee pain after fall.  MRI OF THE LEFT KNEE WITHOUT CONTRAST  Technique:  Multiplanar, multisequence MR imaging of the left knee was performed.  No intravenous contrast was administered.  Comparison:  Radiographs dated 12/13 and 09/10/2012  FINDINGS: There is evidence of an acute transient lateral dislocation of the patella with a subtle impaction fracture of the lateral aspect of the lateral femoral condyle and at a tiny area of avulsion of the inferior medial aspect of the patella as well as a partial avulsion of the medial patellofemoral ligament from the patella.                              MENISCI Medial:  Normal. Lateral:  Normal.  LIGAMENTS Cruciates:  Normal. Collaterals:  Normal.  CARTILAGE Patellofemoral:  The patient has a sheared off a large full- thickness portion of the  articular cartilage of the lateral facet of the patella measuring at least 2.6 x 2.3 cm. Medial:  Normal. Lateral:  Normal.  Joint:  Large joint effusion with blood products and fat globules from the tear is and subtle fractures. Popliteal Fossa:  No Baker's cyst.  Extravasated joint fluid in the soft tissues.  Extensor Mechanism: Distal quadriceps tendon and patellar tendon are normal.  Extensive partial tear of the medial patellofemoral ligament at the patellar attachment with a small avulsion of the cortex of the patella inferior medially. Bones: Subtle impaction fracture of the lateral aspect of the lateral femoral condyle due to the transient patellar dislocation. Small avulsion of bone from the inferior medial aspect of the patella.  These are both quite subtle injuries.  IMPRESSION:  1.  Evidence of recent transient dislocation of the patella with secondary large joint effusion and hemarthrosis. 2.  Subtle impaction fracture of the lateral aspect of the lateral femoral condyle. 3.  Partial avulsion of the medial patellofemoral ligament with a small avulsion of bone from the inferior medial aspect of patella. 4.  Large avulsed full-thickness segment of cartilage from the lateral facet of the patella.   Original Report Authenticated By: Francene Boyers, M.D.    Microbiology: Recent Results (from the past 240 hour(s))  CULTURE, BLOOD (ROUTINE X 2)     Status: Normal (Preliminary result)   Collection Time   09/11/12 11:30 PM      Component Value Range Status Comment   Specimen Description BLOOD RIGHT ANTECUBITAL   Final    Special Requests BOTTLES DRAWN AEROBIC AND ANAEROBIC 4.5CC   Final    Culture  Setup Time 09/12/2012 04:38   Final    Culture     Final    Value:        BLOOD CULTURE RECEIVED NO GROWTH TO DATE CULTURE WILL BE HELD FOR 5 DAYS BEFORE ISSUING A FINAL NEGATIVE REPORT   Report Status PENDING   Incomplete   CULTURE, BLOOD (ROUTINE X 2)     Status: Normal (Preliminary result)    Collection Time   09/11/12 11:40 PM      Component Value Range Status Comment   Specimen Description BLOOD LEFT HAND   Final    Special Requests BOTTLES DRAWN AEROBIC AND ANAEROBIC 1.5CC   Final    Culture  Setup Time 09/12/2012 04:38   Final    Culture     Final    Value:        BLOOD CULTURE RECEIVED NO GROWTH TO DATE CULTURE WILL BE HELD FOR 5 DAYS BEFORE ISSUING A FINAL NEGATIVE REPORT   Report Status PENDING   Incomplete   URINE CULTURE     Status: Normal   Collection Time   09/12/12  4:51 PM      Component Value Range Status Comment   Specimen Description URINE, CLEAN CATCH   Final    Special Requests NONE   Final    Culture  Setup Time 09/12/2012 20:42   Final    Colony Count 75,000 COLONIES/ML   Final    Culture     Final    Value: Multiple bacterial morphotypes present, none predominant. Suggest appropriate recollection if clinically indicated.   Report Status 09/13/2012 FINAL   Final      Labs: Basic Metabolic Panel:  Lab 09/11/12 9604  NA 140  K 3.7  CL 107  CO2 --  GLUCOSE 106*  BUN 14  CREATININE 0.80  CALCIUM --  MG --  PHOS --   CBC:  Lab 09/13/12 0515 09/12/12 0457 09/11/12 1740 09/11/12 1733  WBC 5.3 4.6 -- 6.2  NEUTROABS -- -- -- --  HGB 11.2* 10.7* 11.6* 11.3*  HCT 33.1* 32.6* 34.0* 34.5*  MCV 91.9 92.1 -- 92.0  PLT 128* 120* -- 146*   CBG:  Lab 09/14/12 0807 09/13/12 0845 09/12/12 0851  GLUCAP 97 111* 99    Principal Problem:  *Patellar dislocation Active Problems:  Fever  Lower leg DVT (deep venous thromboembolism), acute  Thrombocytopenia  Anemia   Time coordinating discharge: 25 minutes  Signed:  Brendia Sacks, MD [facility] 09/14/2012, 2:56 PM

## 2012-09-15 NOTE — Progress Notes (Signed)
Clinical Social Work Department CLINICAL SOCIAL WORK PLACEMENT NOTE 09/15/2012  Patient:  Danielle Villegas, Danielle Villegas  Account Number:  [id number] Admit date:  09/11/2012  Clinical Social Worker:  Leron Croak, CLINICAL SOCIAL WORKER  Date/time:  09/12/2012 12:21 PM  Clinical Social Work is seeking post-discharge placement for this patient at the following level of care:   SKILLED NURSING   (*CSW will update this form in Epic as items are completed)   09/12/2012  Patient/family provided with [facility] Department of Clinical Social Work's list of facilities offering this level of care within the geographic area requested by the patient (or if unable, by the patient's family).  09/12/2012  Patient/family informed of their freedom to choose among providers that offer the needed level of care, that participate in Medicare, Medicaid or managed care program needed by the patient, have an available bed and are willing to accept the patient.  09/12/2012  Patient/family informed of [facility]' ownership interest in [facility], as well as of the fact that they are under no obligation to receive care at this facility.  PASARR submitted to EDS on 09/12/2012 PASARR number received from [employer] on 09/12/2012  FL2 transmitted to all facilities in geographic area requested by pt/family on  09/12/2012 FL2 transmitted to all facilities within larger geographic area on 09/12/2012  Patient informed that his/her managed care company has contracts with or will negotiate with  certain facilities, including the following:     Patient/family informed of bed offers received:  09/14/2012 Patient chooses bed at [facility] Physician recommends and patient chooses bed at    Patient to be transferred to [facility] on  09/14/2012 Patient to be transferred to facility by [facility]  The following physician request were entered in Epic:   Additional Comments: [employer] provided  prior authorization for SNF and non emergency ambulance transport.  Cori Razor LCSW [phone]

## 2012-09-18 LAB — CULTURE, BLOOD (ROUTINE X 2): Culture: NO GROWTH

## 2012-09-25 ENCOUNTER — Telehealth: Payer: Self-pay | Admitting: Hematology & Oncology

## 2012-09-25 ENCOUNTER — Other Ambulatory Visit: Payer: Self-pay | Admitting: Hematology & Oncology

## 2012-09-25 DIAGNOSIS — I82409 Acute embolism and thrombosis of unspecified deep veins of unspecified lower extremity: Secondary | ICD-10-CM

## 2012-09-25 NOTE — Telephone Encounter (Signed)
Pt aware of 09-29-12 appointment

## 2012-09-28 ENCOUNTER — Telehealth: Payer: Self-pay | Admitting: Family Medicine
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
## 2012-09-28 NOTE — Telephone Encounter (Signed)
Pt may need ov tomorrow if no better.   Remind her we are closed Wednesday.

## 2012-09-28 NOTE — Telephone Encounter (Signed)
Spoke with patient and she said she was in the bathroom and she did not have access to a calender, she wanted a call back in the morning.      KP

## 2012-09-29 ENCOUNTER — Ambulatory Visit: Payer: Managed Care, Other (non HMO)

## 2012-09-29 ENCOUNTER — Other Ambulatory Visit (HOSPITAL_BASED_OUTPATIENT_CLINIC_OR_DEPARTMENT_OTHER): Payer: Managed Care, Other (non HMO) | Admitting: Lab

## 2012-09-29 ENCOUNTER — Ambulatory Visit (HOSPITAL_BASED_OUTPATIENT_CLINIC_OR_DEPARTMENT_OTHER): Payer: Managed Care, Other (non HMO) | Admitting: Hematology & Oncology

## 2012-09-29 VITALS — BP 142/63 | HR 86 | Temp 98.1°F | Resp 16 | Ht 69.0 in | Wt 140.0 lb

## 2012-09-29 DIAGNOSIS — R110 Nausea: Secondary | ICD-10-CM

## 2012-09-29 DIAGNOSIS — D6859 Other primary thrombophilia: Secondary | ICD-10-CM

## 2012-09-29 DIAGNOSIS — K649 Unspecified hemorrhoids: Secondary | ICD-10-CM

## 2012-09-29 DIAGNOSIS — Z86718 Personal history of other venous thrombosis and embolism: Secondary | ICD-10-CM

## 2012-09-29 DIAGNOSIS — I82409 Acute embolism and thrombosis of unspecified deep veins of unspecified lower extremity: Secondary | ICD-10-CM

## 2012-09-29 LAB — D-DIMER, QUANTITATIVE: D-Dimer, Quant: 1.46 ug/mL-FEU — ABNORMAL HIGH (ref 0.00–0.48)

## 2012-09-29 LAB — CBC WITH DIFFERENTIAL (CANCER CENTER ONLY)
BASO#: 0 10*3/uL (ref 0.0–0.2)
EOS%: 2.5 % (ref 0.0–7.0)
Eosinophils Absolute: 0.1 10*3/uL (ref 0.0–0.5)
HGB: 10.8 g/dL — ABNORMAL LOW (ref 11.6–15.9)
LYMPH#: 0.5 10*3/uL — ABNORMAL LOW (ref 0.9–3.3)
NEUT#: 1.4 10*3/uL — ABNORMAL LOW (ref 1.5–6.5)
Platelets: 299 10*3/uL (ref 145–400)
RBC: 3.56 10*6/uL — ABNORMAL LOW (ref 3.70–5.32)
WBC: 2.4 10*3/uL — ABNORMAL LOW (ref 3.9–10.0)

## 2012-09-29 MED ORDER — PROMETHAZINE HCL 25 MG PO TABS: 25.0000 mg | ORAL_TABLET | Freq: Four times a day (QID) | ORAL | Status: DC | PRN

## 2012-09-29 MED ORDER — HYDROCORTISONE ACE-PRAMOXINE 1-1 % RE CREA: TOPICAL_CREAM | Freq: Three times a day (TID) | RECTAL | Status: DC

## 2012-10-01 ENCOUNTER — Encounter: Payer: Self-pay | Admitting: Internal Medicine

## 2012-10-01 ENCOUNTER — Ambulatory Visit (INDEPENDENT_AMBULATORY_CARE_PROVIDER_SITE_OTHER): Payer: Managed Care, Other (non HMO) | Admitting: Internal Medicine

## 2012-10-01 ENCOUNTER — Other Ambulatory Visit: Payer: Managed Care, Other (non HMO)

## 2012-10-01 ENCOUNTER — Telehealth: Payer: Self-pay | Admitting: *Deleted

## 2012-10-01 VITALS — BP 134/82 | HR 109 | Temp 98.4°F | Ht 69.0 in | Wt 145.0 lb

## 2012-10-01 DIAGNOSIS — R197 Diarrhea, unspecified: Secondary | ICD-10-CM

## 2012-10-01 LAB — FACTOR 5 LEIDEN

## 2012-10-01 MED ORDER — CIPROFLOXACIN HCL 500 MG PO TABS: 500.0000 mg | ORAL_TABLET | Freq: Two times a day (BID) | ORAL | Status: DC

## 2012-10-01 NOTE — Telephone Encounter (Signed)
Patient was seen at [facility] today and husband came by the office to pick up a stool kit.      KP

## 2012-10-01 NOTE — Telephone Encounter (Signed)
1255 - Pt called with c/o diarrhea x 7 days. Stated it started after she was discharged from the nursing home for rehab. It has caused her hemorrhoids to flare up. Dr Myna Hidalgo sees her for DVT management (on Xarelto). Instructed her to call her PCP to be evaluated. Explained that her stool may need to be tested. She wanted to know if there was a kit that her husband could pick up to keep her from having to come in for an appt. Told her that would be up to her PCP. She verbalized understanding and stated she would call them.

## 2012-10-01 NOTE — Patient Instructions (Addendum)
Diarrhea Infections caused by germs (bacterial) or a virus commonly cause diarrhea. Your caregiver has determined that with time, rest and fluids, the diarrhea should improve. In general, eat normally while drinking more water than usual. Although water may prevent dehydration, it does not contain salt and minerals (electrolytes). Broths, weak tea without caffeine and oral rehydration solutions (ORS) replace fluids and electrolytes. Small amounts of fluids should be taken frequently. Large amounts at one time may not be tolerated. Plain water may be harmful in infants and the elderly. Oral rehydrating solutions (ORS) are available at pharmacies and grocery stores. ORS replace water and important electrolytes in proper proportions. Sports drinks are not as effective as ORS and may be harmful due to sugars worsening diarrhea.  ORS is especially recommended for use in children with diarrhea. As a general guideline for children, replace any new fluid losses from diarrhea and/or vomiting with ORS as follows:  If your child weighs 22 pounds or under (10 kg or less), give 60-120 mL ( -  cup or 2 - 4 ounces) of ORS for each episode of diarrheal stool or vomiting episode.  If your child weighs more than 22 pounds (more than 10 kgs), give 120-240 mL ( - 1 cup or 4 - 8 ounces) of ORS for each diarrheal stool or episode of vomiting.  While correcting for dehydration, children should eat normally. However, foods high in sugar should be avoided because this may worsen diarrhea. Large amounts of carbonated soft drinks, juice, gelatin desserts and other highly sugared drinks should be avoided.  After correction of dehydration, other liquids that are appealing to the child may be added. Children should drink small amounts of fluids frequently and fluids should be increased as tolerated. Children should drink enough fluids to keep urine clear or pale yellow.  Adults should eat normally while drinking more fluids than  usual. Drink small amounts of fluids frequently and increase as tolerated. Drink enough fluids to keep urine clear or pale yellow. Broths, weak decaffeinated tea, lemon lime soft drinks (allowed to go flat) and ORS replace fluids and electrolytes.  Avoid:  Carbonated drinks.  Juice.  Extremely hot or cold fluids.  Caffeine drinks.  Fatty, greasy foods.  Alcohol.  Tobacco.  Too much intake of anything at one time.  Gelatin desserts.  Probiotics are active cultures of beneficial bacteria. They may lessen the amount and number of diarrheal stools in adults. Probiotics can be found in yogurt with active cultures and in supplements.  Wash hands well to avoid spreading bacteria and virus.  Anti-diarrheal medications are not recommended for infants and children.  Only take over-the-counter or prescription medicines for pain, discomfort or fever as directed by your caregiver. Do not give aspirin to children because it may cause Reye's Syndrome.  For adults, ask your caregiver if you should continue all prescribed and over-the-counter medicines.  If your caregiver has given you a follow-up appointment, it is very important to keep that appointment. Not keeping the appointment could result in a chronic or permanent injury, and disability. If there is any problem keeping the appointment, you must call back to this facility for assistance. SEEK IMMEDIATE MEDICAL CARE IF:   You or your child is unable to keep fluids down or other symptoms or problems become worse in spite of treatment.  Vomiting or diarrhea develops and becomes persistent.  There is vomiting of blood or bile (green material).  There is blood in the stool or the stools are black and   tarry.  There is no urine output in 6-8 hours or there is only a small amount of very dark urine.  Abdominal pain develops, increases or localizes.  You have a fever.  Your baby is older than 3 months with a rectal temperature of 102 F  (38.9 C) or higher.  Your baby is 3 months old or younger with a rectal temperature of 100.4 F (38 C) or higher.  You or your child develops excessive weakness, dizziness, fainting or extreme thirst.  You or your child develops a rash, stiff neck, severe headache or become irritable or sleepy and difficult to awaken. MAKE SURE YOU:   Understand these instructions.  Will watch your condition.  Will get help right away if you are not doing well or get worse. Document Released: 09/06/2002 Document Revised: 12/09/2011 Document Reviewed: 07/24/2009 Exit[employer] Patient Information 2013 [employer].  

## 2012-10-01 NOTE — Progress Notes (Signed)
Subjective:    Patient ID: Danielle Villegas, female    DOB: [birth year], 57 y.o.   MRN: [id number]  HPI  Pt presents to the clinic today with c/o diarrhea x 4 days. She recently tore some ligaments in her left knee for which she was hospitalized. After she was discharged, she was sent to a rehab facility. ON the day of her discharge home, the diarrhea started. She is having 2 loose stools per day. She is also having some bleeding hemorrhoids which is normal for her when she has diarrhea. She has not been on any antibiotics while she was in the hospital or in rehab. She has been taking immodium which helps but the diarrhea will not go away.  Review of Systems      Past Medical History  Diagnosis Date  . Basal cell carcinoma   . Fibromyalgia   . Lupus   . RA (rheumatoid arthritis)   . Factor 5 Leiden mutation, heterozygous   . Environmental allergies   . Stroke     1997  . Seizure     1997    Current Outpatient Prescriptions  Medication Sig Dispense Refill  . cyclobenzaprine (FLEXERIL) 5 MG tablet Take 1 tablet (5 mg total) by mouth 3 (three) times daily.  30 tablet  0  . LUTEIN PO Take 1 tablet by mouth daily.      . Multiple Vitamin (MULTIVITAMIN WITH MINERALS) TABS Take 1 tablet by mouth daily.      Marland Kitchen oxyCODONE-acetaminophen (PERCOCET/ROXICET) 5-325 MG per tablet Take 1 tablet by mouth every 4 (four) hours. While awake. Hold for sedation.  15 tablet  0  . pramoxine-hydrocortisone (PROCTOCREAM-HC) 1-1 % rectal cream Place rectally 3 (three) times daily.  30 g  0  . predniSONE (DELTASONE) 1 MG tablet Take 2 mg by mouth daily. Take with 5 mg every day      . predniSONE (DELTASONE) 5 MG tablet Take 5 mg by mouth daily. Take with 2 mg every day total of 7 mg. Daily.      . promethazine (PHENERGAN) 25 MG tablet Take 1 tablet (25 mg total) by mouth every 6 (six) hours as needed for nausea.  60 tablet  2  . promethazine (PHENERGAN) 25 MG/ML injection Inject 25 mg into the vein every 6  (six) hours as needed.      . Rivaroxaban (XARELTO) 15 MG TABS tablet Take 1 tablet (15 mg total) by mouth 2 (two) times daily with a meal. Continue through 10/03/2012, then 20mg  po daily.        Allergies  Allergen Reactions  . Dilantin (Phenytoin Sodium Extended) Other (See Comments)    Dizziness  . Sulfa Antibiotics Other (See Comments)    Body Stiffness    Family History  Problem Relation Age of Onset  . Alcohol abuse    . Arthritis    . Breast cancer    . Prostate cancer Father   . Stroke    . Hypertension    . Heart disease      History   Social History  . Marital Status: Married    Spouse Name: N/A    Number of Children: N/A  . Years of Education: N/A   Occupational History  . Not on file.   Social History Main Topics  . Smoking status: Never Smoker   . Smokeless tobacco: Never Used  . Alcohol Use: Yes     Comment: Rarely  . Drug Use: No  . Sexually  Active: Not on file   Other Topics Concern  . Not on file   Social History Narrative  . No narrative on file     Constitutional: Denies fever, malaise, fatigue, headache or abrupt weight changes.  Gastrointestinal: Pt reports diarrhea and bleeding hemorrhoids. Denies abdominal pain, bloating, constipation.  GU: Denies urgency, frequency, pain with urination, burning sensation, blood in urine, odor or discharge.   No other specific complaints in a complete review of systems (except as listed in HPI above).  Objective:   Physical Exam  BP 134/82  Pulse 109  Temp 98.4 F (36.9 C) (Oral)  Ht 5\' 9"  (1.753 m)  Wt 145 lb (65.772 kg)  BMI 21.41 kg/m2  SpO2 96% Wt Readings from Last 3 Encounters:  10/01/12 145 lb (65.772 kg)  09/29/12 140 lb (63.504 kg)  09/11/12 145 lb (65.772 kg)    General: Appears her stated age, well developed, well nourished in NAD. Cardiovascular: Normal rate and rhythm. S1,S2 noted.  No murmur, rubs or gallops noted. No JVD or BLE edema. No carotid bruits  noted. Pulmonary/Chest: Normal effort and positive vesicular breath sounds. No respiratory distress. No wheezes, rales or ronchi noted.  Abdomen: Soft and nontender. Normal bowel sounds, no bruits noted. No distention or masses noted. Liver, spleen and kidneys non palpable.      Assessment & Plan:   Diarrhea, new onset with additional workup required:  Will obtain stool culture, O&P, fecal leuks and giardia Will start Cipro 500 mg BID x 5 days Avoid dairy products Drink plenty of fluids   RTC as needed or if symptoms persist

## 2012-10-01 NOTE — Progress Notes (Signed)
This office note has been dictated.

## 2012-10-02 ENCOUNTER — Other Ambulatory Visit: Payer: Managed Care, Other (non HMO)

## 2012-10-02 DIAGNOSIS — R197 Diarrhea, unspecified: Secondary | ICD-10-CM

## 2012-10-02 NOTE — Progress Notes (Signed)
CC:   Danielle Perla, DO Madlyn Frankel Charlann Boxer, M.D.  DIAGNOSES: 1. Factor V Leiden mutation. 2. Deep vein thrombosis in the left posterior tibial vein. 3. Traumatic, I think, fracture of the left knee.  HISTORY OF PRESENT ILLNESS:  Danielle Villegas is a 57 year old white female.  I think I saw her probably 16 years ago.  At that point in time, I believe that she had a DVT.  She does have a factor V Leiden mutation.  I am not sure if she is homozygous or heterozygous for this. I just cannot get back to those old records.  She was __________ on anticoagulation back when I 1st saw her.  She has had no problems during this time in between visits.  She, unfortunately, fell off a curb on December 12th.  She dislocated her left patella.  She had a subtle impaction fracture of the lateral femoral condyle.  She had a partial avulsion of the medial patellofemoral ligament.  She had a full-thickness avulsed __________ cartilage from the lateral facet of the patella.  There was done by an MRI.  She was immobilized.  She subsequently was found to have a DVT n the left posterior tibial vein.  She was placed on Xarelto.  She has seen Dr. Charlann Boxer of orthopedic surgery.  He needs to operate on her left leg to fix it.  Danielle Villegas wanted to make sure that we were involved so that we could help manage her thromboembolic event and get her through surgery without any complications.  She comes in a wheelchair.  The left leg is immobilized.  She has had no cough or shortness of breath.  She has had no weight loss or weight gain.  She does have fibromyalgia and collagen vascular issues.  She says these are relatively stable.  When she was seen in the emergency room, her CBC showed a white cell count of 4.6, hemoglobin 10.7, hematocrit 32.6, and platelet count 122,000.  PAST MEDICAL HISTORY: 1. Fibromyalgia. 2. Lupus. 3. Rheumatoid arthritis. 4. Memory issues.  ALLERGIES: 1. Dilantin. 2. Sulfa  antibiotics.  MEDICATIONS ARE:  Xarelto 20 mg p.o. daily, prednisone 7 mg p.o. daily, Flexeril 5 mg p.o. t.i.d. I think p.r.n., Percocet as needed, Phenergan 25 mg p.o. q.6 hours p.r.n. nausea.  SOCIAL HISTORY:  Negative for tobacco use.  I do not think there is any significant alcohol use.  FAMILY HISTORY:  I think noncontributory.  There is a history of prostate cancer.  REVIEW OF SYSTEMS:  As stated in history of present illness.  No additional findings noted on a 12-system review.  PHYSICAL EXAMINATION:  This is a well-developed, well-nourished white female in no obvious distress.  Vital signs:  Temperature of 98.1, pulse 86, respiratory rate 16, blood pressure 142/63.  Weight is 140.  Head and neck:  Normocephalic, atraumatic skull.  There are no ocular or oral lesions.  There are no palpable cervical or supraclavicular lymph nodes. Lungs:  Clear bilaterally.  Cardiac:  Regular rate and rhythm with a normal S1 and S2.  There are no murmurs, rubs or bruits.  Abdomen:  Soft with good bowel sounds.  There is no palpable abdominal mass.  There is no fluid wave.  There is no palpable hepatosplenomegaly.  Extremities: Some swelling in the left leg.  The leg has an immobilizer on it.  There is no obvious venous cord noted in the left calf.  Right leg is with no swelling.  She has good range of  motion of the joints in the right leg. She has good pulses in her distal extremities bilaterally. Neurological:  No focal neurological deficits.  LABORATORY STUDIES:  White cell count is 2.4, hemoglobin 10.8, hematocrit 33.1, platelet count 299.  IMPRESSION:  Danielle Villegas is a 57 year old white female with a history of factor V Leiden mutation.  I believe she is heterozygous for this. She has not had a thromboembolic event now for over 15 years.  This recent event appears to be secondary to immobilization of the left leg from her knee dislocation.  I forgot to mention that she does have  hemorrhoid issues.  She has had some bleeding intermittently from her hemorrhoids.  As such, I gave her a prescription for ProctoCream-HC.  If the hemorrhoids continue to have bleeding problems, I think she may need to have these looked at surgically.  She clearly needs to have the knee fixed.  We have to get this done in the safest way possible.  I believe that the best way to do this is with a retrievable IVC filter.  This would help minimize the risk of any thrombus going to the lungs.  Given that she just has a calf thrombus, I think that it would be an unlikely for this to propagate up but yet there would safest, I think, to have a filter placed.  I will need to call Dr. Charlann Boxer and find out when he wants to schedule the surgery so we get the filter placed prior to surgery.  I believe that Danielle Villegas will need 6 months of anticoagulation.  I believe this would be appropriate for her given that this was a calf thromboembolic event and that this occurred with trauma.  We will plan to get Danielle Villegas back to see [place] after she has her surgery.  I really do not anticipate any issues with surgery and clotting/bleeding.  I would make sure that the Xarelto is stopped 48 hours prior to surgery for the left knee.    ______________________________ Josph Macho, M.D. PRE/MEDQ  D:  10/01/2012  T:  10/02/2012  Job:  [id number]

## 2012-10-05 ENCOUNTER — Telehealth: Payer: Self-pay | Admitting: Internal Medicine

## 2012-10-05 LAB — OVA AND PARASITE SCREEN: OP: NONE SEEN

## 2012-10-05 NOTE — Telephone Encounter (Signed)
Pt informed of results.

## 2012-10-05 NOTE — Telephone Encounter (Signed)
Patient is calling to get the results of her stool samples that were taken last week

## 2012-10-05 NOTE — Telephone Encounter (Signed)
Ash, The Cdiff is still pending and so far all of the other test have been negative. Rene Kocher

## 2012-10-06 ENCOUNTER — Other Ambulatory Visit: Payer: Self-pay | Admitting: Internal Medicine

## 2012-10-06 ENCOUNTER — Telehealth: Payer: Self-pay | Admitting: *Deleted

## 2012-10-06 DIAGNOSIS — R197 Diarrhea, unspecified: Secondary | ICD-10-CM

## 2012-10-06 LAB — CLOSTRIDIUM DIFFICILE EIA: CDIFTX: NEGATIVE

## 2012-10-06 LAB — STOOL CULTURE

## 2012-10-06 MED ORDER — METRONIDAZOLE 500 MG PO TABS: 500.0000 mg | ORAL_TABLET | Freq: Three times a day (TID) | ORAL | Status: DC

## 2012-10-06 NOTE — Telephone Encounter (Signed)
Requesting to speak with Danielle Villegas. Wanting to know ha C-diff results came back yet. She is currently have 2 days left of her antibiotic, and she is still having diarrhea in the morning. ? If she is on the right antibiotic...Danielle Villegas

## 2012-10-06 NOTE — Telephone Encounter (Signed)
Pt informed of NP's advisement. She would like [place] to call lab regarding C. Diff result. Pt wants to hold on Flagyl rx until C. Diff  result is. Pt is asking for NP's advisement on what she should do if she takes her last 2 pills tonight and tomorrow and still has diarrhea.

## 2012-10-06 NOTE — Telephone Encounter (Signed)
Danielle Villegas, Then we may have to send her to GI if the diarrhea is persistant. Rene Kocher

## 2012-10-06 NOTE — Telephone Encounter (Signed)
Called [facility] and was informed that lab result may take another 24-48 hours to process. Pt informed of that and also of NP's advisement.

## 2012-10-06 NOTE — Telephone Encounter (Signed)
Ash, The c diff is still being processed. I am unsure why it is taking so long but can call the lab to find out. If she is having a reduction in the bowel movements the Cipro is working but we can call in flagyl as well which would treat cdiff if it were positive. Rene Kocher

## 2012-10-09 ENCOUNTER — Other Ambulatory Visit: Payer: Self-pay | Admitting: *Deleted

## 2012-10-09 DIAGNOSIS — I824Z9 Acute embolism and thrombosis of unspecified deep veins of unspecified distal lower extremity: Secondary | ICD-10-CM

## 2012-10-12 ENCOUNTER — Other Ambulatory Visit: Payer: Self-pay | Admitting: *Deleted

## 2012-10-12 ENCOUNTER — Encounter: Payer: Self-pay | Admitting: Family Medicine

## 2012-10-12 DIAGNOSIS — R110 Nausea: Secondary | ICD-10-CM

## 2012-10-12 DIAGNOSIS — K649 Unspecified hemorrhoids: Secondary | ICD-10-CM

## 2012-10-12 MED ORDER — HYDROCORTISONE ACE-PRAMOXINE 1-1 % RE CREA: TOPICAL_CREAM | Freq: Three times a day (TID) | RECTAL | Status: DC

## 2012-10-12 NOTE — Telephone Encounter (Signed)
Pt called requesting a refill on her Protocream-HC. She stated "I know I need to have this addressed and may put off my knee surgery until then".  Refilled x 1 and gave her Dr Buccini's # as she was looking for a recommendation from Dr. Myna Hidalgo.

## 2012-10-13 ENCOUNTER — Other Ambulatory Visit: Payer: Self-pay | Admitting: Hematology & Oncology

## 2012-10-13 ENCOUNTER — Ambulatory Visit: Payer: Managed Care, Other (non HMO) | Admitting: Family Medicine

## 2012-10-13 ENCOUNTER — Other Ambulatory Visit: Payer: Self-pay | Admitting: *Deleted

## 2012-10-13 DIAGNOSIS — I82409 Acute embolism and thrombosis of unspecified deep veins of unspecified lower extremity: Secondary | ICD-10-CM

## 2012-10-13 DIAGNOSIS — I824Z9 Acute embolism and thrombosis of unspecified deep veins of unspecified distal lower extremity: Secondary | ICD-10-CM

## 2012-10-13 MED ORDER — RIVAROXABAN 20 MG PO TABS: 20.0000 mg | ORAL_TABLET | Freq: Every day | ORAL | Status: DC

## 2012-10-13 NOTE — Telephone Encounter (Signed)
Pt called on Friday. Needs to have surgery on her knee. Was given a surgery date of 10/22/12 but needs to have a filter placed and instructions on when to stop and start her Xarelto. Of note; she is still taking the loading dose of Xarelto. She needs to have a rx sent to [facility] for 90 day supply. Sent via e-rx per her request at 20 mg per day. To stop taking 24 hours before filter placement then resume day after and stop 48 hours before knee surgery then resume day after. No bridge needed. Will send her a copy of the instructions.

## 2012-10-13 NOTE — Telephone Encounter (Signed)
Pt called requesting a rx for Xarelto be sent to her local pharmacy to cover her until she receives her mail order as she is still on the loading dose. Sent via e-rx to [facility] pharmacy on file.

## 2012-10-14 ENCOUNTER — Encounter (HOSPITAL_COMMUNITY): Payer: Self-pay | Admitting: Pharmacy Technician

## 2012-10-14 ENCOUNTER — Other Ambulatory Visit: Payer: Self-pay | Admitting: Physician Assistant

## 2012-10-21 ENCOUNTER — Encounter (HOSPITAL_COMMUNITY): Payer: Self-pay

## 2012-10-21 ENCOUNTER — Ambulatory Visit (HOSPITAL_COMMUNITY)
Admission: RE | Admit: 2012-10-21 | Discharge: 2012-10-21 | Disposition: A | Discharge status: Home / Self Care | Payer: Managed Care, Other (non HMO) | Source: Ambulatory Visit | Attending: Hematology & Oncology | Admitting: Hematology & Oncology

## 2012-10-21 DIAGNOSIS — I82409 Acute embolism and thrombosis of unspecified deep veins of unspecified lower extremity: Secondary | ICD-10-CM

## 2012-10-21 DIAGNOSIS — D6859 Other primary thrombophilia: Secondary | ICD-10-CM | POA: Insufficient documentation

## 2012-10-21 LAB — CBC WITH DIFFERENTIAL/PLATELET
Basophils Relative: 0 % (ref 0–1)
Eosinophils Absolute: 0 10*3/uL (ref 0.0–0.7)
HCT: 35.4 % — ABNORMAL LOW (ref 36.0–46.0)
Hemoglobin: 11.6 g/dL — ABNORMAL LOW (ref 12.0–15.0)
MCH: 30.1 pg (ref 26.0–34.0)
MCHC: 32.8 g/dL (ref 30.0–36.0)
Monocytes Absolute: 0.5 10*3/uL (ref 0.1–1.0)
Monocytes Relative: 16 % — ABNORMAL HIGH (ref 3–12)
RDW: 14.3 % (ref 11.5–15.5)

## 2012-10-21 LAB — BASIC METABOLIC PANEL
BUN: 15 mg/dL (ref 6–23)
Creatinine, Ser: 0.76 mg/dL (ref 0.50–1.10)
GFR calc non Af Amer: >90 mL/min (ref >90)
Glucose, Bld: 87 mg/dL (ref 70–99)
Potassium: 3.4 mEq/L — ABNORMAL LOW (ref 3.5–5.1)

## 2012-10-21 MED ORDER — MIDAZOLAM HCL 2 MG/2ML IJ SOLN
INTRAMUSCULAR | Status: AC | PRN
  Administered 2012-10-21: 1 mg via INTRAVENOUS

## 2012-10-21 MED ORDER — SODIUM CHLORIDE 0.9 % IV SOLN: Freq: Once | INTRAVENOUS | Status: DC

## 2012-10-21 MED ORDER — IOHEXOL 300 MG/ML  SOLN: 60.0000 mL | Freq: Once | INTRAMUSCULAR | Status: DC | PRN

## 2012-10-21 MED ORDER — FENTANYL CITRATE 0.05 MG/ML IJ SOLN
INTRAMUSCULAR | Status: AC
  Filled 2012-10-21: qty 4

## 2012-10-21 MED ORDER — FENTANYL CITRATE 0.05 MG/ML IJ SOLN
INTRAMUSCULAR | Status: AC | PRN
  Administered 2012-10-21: 50 ug via INTRAVENOUS

## 2012-10-21 MED ORDER — MIDAZOLAM HCL 2 MG/2ML IJ SOLN
INTRAMUSCULAR | Status: AC
  Filled 2012-10-21: qty 4

## 2012-10-21 MED ORDER — LIDOCAINE HCL 1 % IJ SOLN
INTRAMUSCULAR | Status: AC
  Filled 2012-10-21: qty 20

## 2012-10-21 NOTE — Progress Notes (Signed)
Pt may eat per Hinton Rao. Pt was given cola and cheese/crackers. Pt tolerated snack well.

## 2012-10-21 NOTE — H&P (Signed)
Chief Complaint: "I'm here for a filter" Referring Physician:Ennever HPI: Danielle Villegas is an 57 y.o. female with hx of Factor V Leiden mutation who suffered a recent injury to her (L)knee. She then developed an acute DVT of the (L)PTV. She has been started on Xarelto, but will need surgery on her knee. She is high risk for development of new DVT while off her anticoagulation and possible propagation of existing clot, and therefore she is scheduled for placement of IVC filter. The hope is that she will recover well and that the filter can be retrieved. PMHx and meds reviewed.  Past Medical History:  Past Medical History  Diagnosis Date  . Basal cell carcinoma   . Fibromyalgia   . Lupus   . RA (rheumatoid arthritis)   . Factor 5 Leiden mutation, heterozygous   . Environmental allergies   . Stroke     1997  . Seizure     1997    Past Surgical History:  Past Surgical History  Procedure Date  . Tonsillectomy and adenoidectomy     Family History:  Family History  Problem Relation Age of Onset  . Alcohol abuse    . Arthritis    . Breast cancer    . Prostate cancer Father   . Stroke    . Hypertension    . Heart disease      Social History:  reports that she has never smoked. She has never used smokeless tobacco. She reports that she drinks alcohol. She reports that she does not use illicit drugs.  Allergies:  Allergies  Allergen Reactions  . Dilantin (Phenytoin Sodium Extended) Other (See Comments)    Dizziness  . Sulfa Antibiotics Other (See Comments)    Body Stiffness    Medications: cyclobenzaprine (FLEXERIL) 10 MG tablet (Taking) Sig - Route: Take 10 mg by mouth 3 (three) times daily as needed. For muscle spasms. - Oral Class: Historical Med Number of times this order has been changed since signing: 1 Order [address] HYDROcodone-acetaminophen (NORCO) 10-325 MG per tablet (Taking) Sig - Route: Take 1 tablet by mouth every 6 (six) hours as needed. For pain. - Oral  Class: Historical Med Number of times this order has been changed since signing: 1 Order [address] pramoxine-hydrocortisone (PROCTOCREAM-HC) 1-1 % rectal cream (Taking) 10/12/2012 Sig - Route: Place 1 application rectally 3 (three) times daily as needed. For hemorrhoids. - Rectal Class: Historical Med Number of times this order has been changed since signing: 3 Order [address] predniSONE (DELTASONE) 1 MG tablet (Taking) 04/20/2012 Sig - Route: Take 2 mg by mouth every morning. She takes two tablets with a 5mg  tablet to equal her dose of 7mg . - Oral Class: Historical Med Number of times this order has been changed since signing: 6 Order [address] predniSONE (DELTASONE) 5 MG tablet (Taking) 04/20/2012 Sig - Route: Take 5 mg by mouth every morning. She takes one tablet with two 1mg  tablets to equal her dose of 7mg . - Oral Class: Historical Med Number of times this order has been changed since signing: 6 Order [address] promethazine (PHENERGAN) 25 MG tablet (Taking) 09/29/2012 Sig - Route: Take 25 mg by mouth every 6 (six) hours as needed. For nausea. - Oral Class: Historical Med Number of times this order has been changed since signing: 2 Order [address] ranitidine (ZANTAC) 75 MG tablet (Taking) Sig - Route: Take 75 mg by mouth every evening. - Oral Class: Historical Med Number of times this order has  been changed since signing: 2 Order [address] Rivaroxaban (XARELTO) 20 MG TABS (Taking) 10/13/2012 Sig - Route: Take 20 mg by mouth every evening. -    Please HPI for pertinent positives, otherwise complete 10 system ROS negative.  Physical Exam: Blood pressure 139/77, pulse 89, temperature 98.6 F (37 C), temperature source Oral, resp. rate 21, SpO2 98.00%. There is no height or weight on file to calculate BMI.   General Appearance:  Alert, cooperative, no distress, appears stated age  Head:  Normocephalic, without obvious abnormality, atraumatic  ENT: Unremarkable  Neck: Supple, symmetrical, trachea  midline, no adenopathy, thyroid: not enlarged, symmetric, no tenderness/mass/nodules  Lungs:   Clear to auscultation bilaterally, no w/r/r, respirations unlabored without use of accessory muscles.  Chest Wall:  No tenderness or deformity  Heart:  Regular rate and rhythm, S1, S2 normal, no murmur, rub or gallop. Carotids 2+ without bruit.  Extremities: (L)knee brace intact, trace-1+edema of (L)lower leg noted.  Neurologic: Normal affect, no gross deficits.   Results for orders placed during the hospital encounter of 10/21/12 (from the past 48 hour(s))  APTT     Status: Normal   Collection Time   10/21/12  8:21 AM      Component Value Range Comment   aPTT 30  24 - 37 seconds   BASIC METABOLIC PANEL     Status: Abnormal   Collection Time   10/21/12  8:21 AM      Component Value Range Comment   Sodium 138  135 - 145 mEq/L    Potassium 3.4 (*) 3.5 - 5.1 mEq/L    Chloride 101  96 - 112 mEq/L    CO2 27  19 - 32 mEq/L    Glucose, Bld 87  70 - 99 mg/dL    BUN 15  6 - 23 mg/dL    Creatinine, Ser 1.19  0.50 - 1.10 mg/dL    Calcium 14.7  8.4 - 10.5 mg/dL    GFR calc non Af Amer >90  >90 mL/min    GFR calc Af Amer >90  >90 mL/min   CBC WITH DIFFERENTIAL     Status: Abnormal   Collection Time   10/21/12  8:21 AM      Component Value Range Comment   WBC 3.3 (*) 4.0 - 10.5 K/uL    RBC 3.86 (*) 3.87 - 5.11 MIL/uL    Hemoglobin 11.6 (*) 12.0 - 15.0 g/dL    HCT 82.9 (*) 56.2 - 46.0 %    MCV 91.7  78.0 - 100.0 fL    MCH 30.1  26.0 - 34.0 pg    MCHC 32.8  30.0 - 36.0 g/dL    RDW 13.0  86.5 - 78.4 %    Platelets 244  150 - 400 K/uL    Neutrophils Relative 61  43 - 77 %    Neutro Abs 2.0  1.7 - 7.7 K/uL    Lymphocytes Relative 22  12 - 46 %    Lymphs Abs 0.7  0.7 - 4.0 K/uL    Monocytes Relative 16 (*) 3 - 12 %    Monocytes Absolute 0.5  0.1 - 1.0 K/uL    Eosinophils Relative 1  0 - 5 %    Eosinophils Absolute 0.0  0.0 - 0.7 K/uL    Basophils Relative 0  0 - 1 %    Basophils Absolute 0.0  0.0  - 0.1 K/uL   PROTIME-INR     Status: Abnormal   Collection  Time   10/21/12  8:21 AM      Component Value Range Comment   Prothrombin Time 11.3 (*) 11.6 - 15.2 seconds    INR 0.82  0.00 - 1.49    No results found.  Assessment/Plan (L)LE DVT, Factor V Leiden mutation Traumatic injury to (L)knee requiring surgery tomorrow. Discussed indications for IVC filter placement and likely retrieval. Explained procedure, including risks, complications. Labs ok Consent signed in chart  Brayton El PA-C 10/21/2012, 9:01 AM

## 2012-10-21 NOTE — Procedures (Signed)
Successful placement of an infrarenal IVC filter.  No immediate complications.  

## 2012-11-06 ENCOUNTER — Other Ambulatory Visit: Payer: Self-pay | Admitting: *Deleted

## 2012-11-06 NOTE — Telephone Encounter (Signed)
Pt left VM stating that [employer] had faxed [place] about her trazodone and has yet to hear anything back. .Please advise

## 2012-11-09 NOTE — Telephone Encounter (Signed)
Spoke with patient and this call was in reference to her daughter.      KP

## 2012-11-10 ENCOUNTER — Ambulatory Visit: Payer: Managed Care, Other (non HMO) | Attending: Orthopedic Surgery | Admitting: Physical Therapy

## 2012-11-10 DIAGNOSIS — M25569 Pain in unspecified knee: Secondary | ICD-10-CM | POA: Insufficient documentation

## 2012-11-10 DIAGNOSIS — R262 Difficulty in walking, not elsewhere classified: Secondary | ICD-10-CM | POA: Insufficient documentation

## 2012-11-10 DIAGNOSIS — M25669 Stiffness of unspecified knee, not elsewhere classified: Secondary | ICD-10-CM | POA: Insufficient documentation

## 2012-11-10 DIAGNOSIS — IMO0001 Reserved for inherently not codable concepts without codable children: Secondary | ICD-10-CM | POA: Insufficient documentation

## 2012-11-12 ENCOUNTER — Ambulatory Visit: Payer: Managed Care, Other (non HMO) | Admitting: Rehabilitation

## 2012-11-17 ENCOUNTER — Ambulatory Visit: Payer: Managed Care, Other (non HMO) | Admitting: Rehabilitation

## 2012-11-19 ENCOUNTER — Ambulatory Visit: Payer: Managed Care, Other (non HMO)

## 2012-11-24 ENCOUNTER — Ambulatory Visit: Payer: Managed Care, Other (non HMO) | Admitting: Rehabilitation

## 2012-11-26 ENCOUNTER — Ambulatory Visit: Payer: Managed Care, Other (non HMO) | Admitting: Physical Therapy

## 2012-11-30 ENCOUNTER — Encounter: Payer: Managed Care, Other (non HMO) | Admitting: Physical Therapy

## 2012-12-03 ENCOUNTER — Ambulatory Visit: Payer: Managed Care, Other (non HMO) | Attending: Orthopedic Surgery | Admitting: Rehabilitation

## 2012-12-03 DIAGNOSIS — IMO0001 Reserved for inherently not codable concepts without codable children: Secondary | ICD-10-CM | POA: Insufficient documentation

## 2012-12-03 DIAGNOSIS — M25669 Stiffness of unspecified knee, not elsewhere classified: Secondary | ICD-10-CM | POA: Insufficient documentation

## 2012-12-03 DIAGNOSIS — M25569 Pain in unspecified knee: Secondary | ICD-10-CM | POA: Insufficient documentation

## 2012-12-03 DIAGNOSIS — R262 Difficulty in walking, not elsewhere classified: Secondary | ICD-10-CM | POA: Insufficient documentation

## 2012-12-07 ENCOUNTER — Ambulatory Visit: Payer: Managed Care, Other (non HMO) | Admitting: Physical Therapy

## 2012-12-10 ENCOUNTER — Ambulatory Visit: Payer: Managed Care, Other (non HMO) | Admitting: Rehabilitation

## 2012-12-14 ENCOUNTER — Ambulatory Visit: Payer: Managed Care, Other (non HMO) | Admitting: Physical Therapy

## 2012-12-15 ENCOUNTER — Ambulatory Visit: Payer: Managed Care, Other (non HMO) | Admitting: Physical Therapy

## 2012-12-16 ENCOUNTER — Telehealth: Payer: Self-pay | Admitting: Hematology & Oncology

## 2012-12-16 NOTE — Telephone Encounter (Signed)
Pt made 4-22 appointment

## 2012-12-17 ENCOUNTER — Ambulatory Visit: Payer: Managed Care, Other (non HMO) | Admitting: Physical Therapy

## 2012-12-21 ENCOUNTER — Ambulatory Visit: Payer: Managed Care, Other (non HMO) | Admitting: Rehabilitation

## 2012-12-24 ENCOUNTER — Ambulatory Visit: Payer: Managed Care, Other (non HMO) | Admitting: Rehabilitation

## 2012-12-28 ENCOUNTER — Telehealth: Payer: Self-pay | Admitting: Family Medicine

## 2012-12-28 ENCOUNTER — Ambulatory Visit: Payer: Managed Care, Other (non HMO) | Admitting: Physical Therapy

## 2012-12-28 NOTE — Telephone Encounter (Signed)
Patient requests to switch from Dr. Laury Axon to Dr. Beverely Low. Okayed with both doctors.

## 2012-12-31 ENCOUNTER — Ambulatory Visit: Payer: Managed Care, Other (non HMO) | Attending: Orthopedic Surgery | Admitting: Rehabilitation

## 2012-12-31 DIAGNOSIS — R262 Difficulty in walking, not elsewhere classified: Secondary | ICD-10-CM | POA: Insufficient documentation

## 2012-12-31 DIAGNOSIS — M25569 Pain in unspecified knee: Secondary | ICD-10-CM | POA: Insufficient documentation

## 2012-12-31 DIAGNOSIS — IMO0001 Reserved for inherently not codable concepts without codable children: Secondary | ICD-10-CM | POA: Insufficient documentation

## 2012-12-31 DIAGNOSIS — M25669 Stiffness of unspecified knee, not elsewhere classified: Secondary | ICD-10-CM | POA: Insufficient documentation

## 2013-01-04 ENCOUNTER — Ambulatory Visit: Payer: Managed Care, Other (non HMO) | Admitting: Rehabilitation

## 2013-01-06 ENCOUNTER — Ambulatory Visit (INDEPENDENT_AMBULATORY_CARE_PROVIDER_SITE_OTHER): Payer: Managed Care, Other (non HMO) | Admitting: Family Medicine

## 2013-01-06 ENCOUNTER — Encounter: Payer: Self-pay | Admitting: Family Medicine

## 2013-01-06 VITALS — BP 140/82 | HR 90 | Temp 98.2°F | Ht 68.25 in | Wt 139.8 lb

## 2013-01-06 DIAGNOSIS — M359 Systemic involvement of connective tissue, unspecified: Secondary | ICD-10-CM

## 2013-01-06 DIAGNOSIS — F329 Major depressive disorder, single episode, unspecified: Secondary | ICD-10-CM

## 2013-01-06 DIAGNOSIS — F341 Dysthymic disorder: Secondary | ICD-10-CM

## 2013-01-06 DIAGNOSIS — R030 Elevated blood-pressure reading, without diagnosis of hypertension: Secondary | ICD-10-CM

## 2013-01-06 MED ORDER — PREDNISONE 5 MG PO TABS: 5.0000 mg | ORAL_TABLET | Freq: Every morning | ORAL | Status: DC

## 2013-01-06 MED ORDER — PREDNISONE 1 MG PO TABS: 2.0000 mg | ORAL_TABLET | Freq: Every morning | ORAL | Status: DC

## 2013-01-06 NOTE — Progress Notes (Signed)
  Subjective:    Patient ID: Danielle Villegas, female    DOB: [birth year], 57 y.o.   MRN: [id number]  HPI Elevated BP- pt reports that since knee dislocation BP has been running high.  Initially thought it was due to pain.  But BP remained elevated at subsequent f/u visits.  No CP, SOB, HAs, visual changes.  Has eliminated salt from diet.  Pt reports BP is better today.  Lupus like syndrome- pt was following w/ Dr Phylliss Bob previously.  No current rheumatologist.  Pt reports she is on daily prednisone 7 mg daily.  Would like to wean.  Has been on steroids for over 15 yrs (was previously on as much as 20mg  daily).  Due for refill today.  Depression- pt strongly suspects daughter is bipolar.  Having a lot of difficulty w/ her mood swings.  She will frequently run away.  Mom fears confronting her about her issues b/c she thinks daughter will shut her out.  Review of Systems For ROS see HPI     Objective:   Physical Exam  Vitals reviewed. Constitutional: She is oriented to person, place, and time. She appears well-developed and well-nourished. No distress.  HENT:  Head: Normocephalic and atraumatic.  Eyes: Conjunctivae and EOM are normal. Pupils are equal, round, and reactive to light.  Neck: Normal range of motion. Neck supple. No thyromegaly present.  Cardiovascular: Normal rate, regular rhythm, normal heart sounds and intact distal pulses.   No murmur heard. Pulmonary/Chest: Effort normal and breath sounds normal. No respiratory distress.  Abdominal: Soft. She exhibits no distension. There is no tenderness.  Musculoskeletal: She exhibits no edema.  Lymphadenopathy:    She has no cervical adenopathy.  Neurological: She is alert and oriented to person, place, and time.  Skin: Skin is warm and dry.  Psychiatric:  Flat affect, tearful when talking about her daughter          Assessment & Plan:

## 2013-01-06 NOTE — Patient Instructions (Addendum)
Follow up in 1 month to recheck BP No need for meds at this time but we'll pay close attention Call with any questions or concerns Hang in there!!!

## 2013-01-07 ENCOUNTER — Ambulatory Visit: Payer: Managed Care, Other (non HMO) | Admitting: Physical Therapy

## 2013-01-10 DIAGNOSIS — F329 Major depressive disorder, single episode, unspecified: Secondary | ICD-10-CM | POA: Insufficient documentation

## 2013-01-10 DIAGNOSIS — M359 Systemic involvement of connective tissue, unspecified: Secondary | ICD-10-CM | POA: Insufficient documentation

## 2013-01-10 NOTE — Assessment & Plan Note (Signed)
New.  Due to daughter's likely mental illness.  Encouraged them both to get help for current situation.  Pt not currently interested in starting meds at this time, but will follow.

## 2013-01-10 NOTE — Assessment & Plan Note (Signed)
New to provider, ongoing for pt.  Asking for refill on prednisone today.  Pt is steroid dependent.  Will refer to rheum for ongoing management.  Pt expressed understanding and is in agreement w/ plan.

## 2013-01-10 NOTE — Assessment & Plan Note (Signed)
New.  Suspect that pt's BP was elevated at ortho f/u due to pain and difficulty ambulating to appt.  BP slightly elevated today but pt very upset about her daughter running away last night.  Will continue to follow closely.  Pt expressed understanding and is in agreement w/ plan.

## 2013-01-11 ENCOUNTER — Ambulatory Visit: Payer: Managed Care, Other (non HMO) | Admitting: Rehabilitation

## 2013-01-14 ENCOUNTER — Ambulatory Visit: Payer: Managed Care, Other (non HMO) | Admitting: Rehabilitation

## 2013-01-18 ENCOUNTER — Ambulatory Visit: Payer: Managed Care, Other (non HMO) | Admitting: Physical Therapy

## 2013-01-18 ENCOUNTER — Encounter: Payer: Self-pay | Admitting: Lab

## 2013-01-19 ENCOUNTER — Ambulatory Visit (HOSPITAL_BASED_OUTPATIENT_CLINIC_OR_DEPARTMENT_OTHER): Payer: Managed Care, Other (non HMO) | Admitting: Lab

## 2013-01-19 ENCOUNTER — Ambulatory Visit (HOSPITAL_BASED_OUTPATIENT_CLINIC_OR_DEPARTMENT_OTHER): Payer: Managed Care, Other (non HMO)

## 2013-01-19 ENCOUNTER — Ambulatory Visit (HOSPITAL_BASED_OUTPATIENT_CLINIC_OR_DEPARTMENT_OTHER): Payer: Managed Care, Other (non HMO) | Admitting: Hematology & Oncology

## 2013-01-19 VITALS — BP 170/71 | HR 76 | Temp 97.7°F | Resp 16 | Ht 67.0 in | Wt 136.0 lb

## 2013-01-19 DIAGNOSIS — Z86718 Personal history of other venous thrombosis and embolism: Secondary | ICD-10-CM

## 2013-01-19 DIAGNOSIS — D6859 Other primary thrombophilia: Secondary | ICD-10-CM

## 2013-01-19 DIAGNOSIS — I824Z2 Acute embolism and thrombosis of unspecified deep veins of left distal lower extremity: Secondary | ICD-10-CM

## 2013-01-19 DIAGNOSIS — I824Z1 Acute embolism and thrombosis of unspecified deep veins of right distal lower extremity: Secondary | ICD-10-CM

## 2013-01-19 LAB — CBC WITH DIFFERENTIAL (CANCER CENTER ONLY)
BASO%: 0.4 % (ref 0.0–2.0)
EOS%: 1.9 % (ref 0.0–7.0)
HCT: 37.3 % (ref 34.8–46.6)
LYMPH%: 27 % (ref 14.0–48.0)
MCH: 31.1 pg (ref 26.0–34.0)
MCHC: 33 g/dL (ref 32.0–36.0)
MCV: 94 fL (ref 81–101)
MONO%: 18.1 % — ABNORMAL HIGH (ref 0.0–13.0)
NEUT%: 52.6 % (ref 39.6–80.0)
RDW: 13.4 % (ref 11.1–15.7)

## 2013-01-19 NOTE — Progress Notes (Signed)
This office note has been dictated.

## 2013-01-20 ENCOUNTER — Ambulatory Visit: Payer: Managed Care, Other (non HMO) | Admitting: Hematology & Oncology

## 2013-01-20 ENCOUNTER — Telehealth: Payer: Self-pay | Admitting: Hematology & Oncology

## 2013-01-20 ENCOUNTER — Other Ambulatory Visit: Payer: Managed Care, Other (non HMO) | Admitting: Lab

## 2013-01-20 ENCOUNTER — Inpatient Hospital Stay: Admission: RE | Admit: 2013-01-20 | Payer: Managed Care, Other (non HMO) | Source: Ambulatory Visit

## 2013-01-20 NOTE — Telephone Encounter (Signed)
Pt had doppler scheduled for 4-22 but canceled because insurance we are not preferred provider. I schedule doppler for today but pt wants to do it next week. She has number to call and schedule for day that is better for her. Dr. Myna Hidalgo aware.

## 2013-01-20 NOTE — Progress Notes (Signed)
CC:   Madlyn Frankel. Charlann Boxer, M.D. Lelon Perla, DO  DIAGNOSIS: 1. Deep vein thrombosis of the left posterior tibial vein. 2. Factor V Leiden mutation-heterozygous. 3. Status post repair of left knee fracture.  CURRENT THERAPY: 1. Xarelto 20 mg p.o. daily. 2. The patient has an IVC filter in place prior to her surgery.  INTERIM HISTORY:  Ms. Hirschmann comes in for followup.  She had her knee surgery.  This was the left knee.  She had this back in January.  She got through this okay.  We did have an IVC filter placed prior to this.  Ms. Blase really would like to have the filter in permanently. This, I suppose, could be done for her.  She needs that "peace of mind" so that she does not worry about blood clots going off into her lung.  She is on Xarelto.  She has noticed some swelling in the left lower leg. She is worried about DVT.  We will go ahead and get a Doppler.  Otherwise, she has had no problems with bleeding.  She has had a slow recovery from the knee surgery.  She may need to have a manual manipulation under anesthesia of the left knee to try to get it moving better.  She has had no cough.  She has had no fever.  She has had no change in bowel or bladder habits.  PHYSICAL EXAMINATION:  General:  This is a well-developed, well- nourished white female in no obvious distress.  Vital signs: Temperature of 97.7, pulse 76, respiratory rate 16, blood pressure 170/71.  Weight is 136.  Head and neck:  Normocephalic, atraumatic skull.  There are no ocular or oral lesions.  There are no palpable cervical or supraclavicular lymph nodes.  Lungs:  Clear bilaterally. Cardiac:  Regular rate and rhythm with a normal S1 and S2.  There are no murmurs, rubs, or bruits.  Abdomen:  Soft with good bowel sounds.  There is no palpable abdominal mass.  There is no fluid wave.  There is no palpable hepatosplenomegaly.  Extremities:  Left knee surgical scar. This is well healed.  She has a  knee brace on the left leg.  She has some slight swelling in the left lower leg.  She has no obvious venous cord.  She has a negative Homans sign.  She has good pulses in her distal extremities.  Skin:  No rashes, ecchymosis, or petechia.  LABORATORY STUDIES:  White cell count is 2.7, hemoglobin 12.3, hematocrit 37.3, platelet count 146.  IMPRESSION:  Ms. Ransom is a very nice 57 year old white female who is heterozygous for the factor V Leiden mutation.  She had a deep vein thrombosis of the left posterior tibial vein.  I have her on Xarelto.  I would like to keep her on anticoagulation for a good 6 months.  A lot will depend on how long she is going to have the knee brace on and her immobility.  She does have the IVC filter in which will help.  We will go ahead and get a Doppler of her left leg for right now.  I will go ahead and plan to get her back in 2 months.  Will see what the Doppler shows, but I feel that this should be negative.    ______________________________ Josph Macho, M.D. PRE/MEDQ  D:  01/19/2013  T:  01/20/2013  Job:  [id number]

## 2013-01-21 ENCOUNTER — Ambulatory Visit: Payer: Managed Care, Other (non HMO) | Admitting: Rehabilitation

## 2013-01-21 ENCOUNTER — Ambulatory Visit
Admission: RE | Admit: 2013-01-21 | Discharge: 2013-01-21 | Disposition: A | Discharge status: Home / Self Care | Payer: Managed Care, Other (non HMO) | Source: Ambulatory Visit | Attending: Hematology & Oncology | Admitting: Hematology & Oncology

## 2013-01-21 ENCOUNTER — Encounter (HOSPITAL_COMMUNITY): Payer: Self-pay | Admitting: Pharmacy Technician

## 2013-01-21 ENCOUNTER — Encounter (HOSPITAL_COMMUNITY)
Admission: RE | Admit: 2013-01-21 | Discharge: 2013-01-21 | Disposition: A | Discharge status: Home / Self Care | Payer: Managed Care, Other (non HMO) | Source: Ambulatory Visit | Attending: Orthopedic Surgery | Admitting: Orthopedic Surgery

## 2013-01-21 ENCOUNTER — Encounter (HOSPITAL_COMMUNITY): Payer: Self-pay

## 2013-01-21 DIAGNOSIS — I824Z2 Acute embolism and thrombosis of unspecified deep veins of left distal lower extremity: Secondary | ICD-10-CM

## 2013-01-21 HISTORY — DX: Anxiety disorder, unspecified: F41.9

## 2013-01-21 HISTORY — DX: Urinary tract infection, site not specified: N39.0

## 2013-01-21 HISTORY — DX: Anemia, unspecified: D64.9

## 2013-01-21 HISTORY — DX: Pneumonia, unspecified organism: J18.9

## 2013-01-21 HISTORY — DX: Acute embolism and thrombosis of unspecified deep veins of unspecified lower extremity: I82.409

## 2013-01-21 LAB — URINALYSIS, ROUTINE W REFLEX MICROSCOPIC
Bilirubin Urine: NEGATIVE
Ketones, ur: NEGATIVE mg/dL
Nitrite: NEGATIVE
Protein, ur: NEGATIVE mg/dL
Urobilinogen, UA: 0.2 mg/dL (ref 0.0–1.0)

## 2013-01-21 LAB — BASIC METABOLIC PANEL
CO2: 28 mEq/L (ref 19–32)
Chloride: 104 mEq/L (ref 96–112)
Glucose, Bld: 105 mg/dL — ABNORMAL HIGH (ref 70–99)
Sodium: 141 mEq/L (ref 135–145)

## 2013-01-21 LAB — PROTIME-INR
INR: 1.1 (ref 0.00–1.49)
Prothrombin Time: 14.1 seconds (ref 11.6–15.2)

## 2013-01-21 LAB — APTT: aPTT: 34 seconds (ref 24–37)

## 2013-01-21 NOTE — Patient Instructions (Signed)
Danielle Villegas  01/21/2013   Your procedure is scheduled on:  01/25/13   Report to [facility] at1250pm  Call this number if you have problems the morning of surgery: [phone]   Remember:   Do not eat food after midnite. Clear liquids until 0900am then npo.     Take these medicines the morning of surgery with A SIP OF WATER:    Do not wear jewelry, make-up or nail polish.  Do not wear lotions, powders, or perfumes.   Do not shave 48 hours prior to surgery.   Do not bring valuables to the hospital.  Contacts, dentures or bridgework may not be worn into surgery.  Leave suitcase in the car. After surgery it may be brought to your room.  For patients admitted to the hospital, checkout time is 11:00 AM the day of  discharge.     SEE CHG INSTRUCTION SHEET    Please read over the following fact sheets that you were given: MRSA Information, coughing and deep breathing exercises, leg exercises               Failure to comply with these instructions may result in cancellation of your surgery.                Patient Signature ____________________________              Nurse Signature _____________________________

## 2013-01-21 NOTE — Progress Notes (Signed)
Need orders asap pre op 4-24 230 pm thnaks

## 2013-01-24 NOTE — H&P (Signed)
Danielle Villegas is an 57 y.o. female.    Chief Complaint:   Left knee stiffness s/p knee scope   HPI:   Danielle Villegas is a 57 year old female now 13 weeks after an open repair of a dislocating patella with instability with an open medial patellofemoral ligament repair. She has been working extensively with physical therapy trying to improve her overall motion in the postoperative period but at this point has reached an impasse with her motion. She is having difficulty with pain with flexion beyond about 70 degrees. After reviewing with Danielle Villegas her current situation Dr. Charlann Boxer tried to optimize her overall recovery we will take her to the operating room for gentle manipulation reviewing the risks. Now three months out I feel her repair should be strong enough to tolerate some gentle stretching and hopefully easy lysis of adhesions, improved motion. She will need to continue to work with physical therapy extensively to maximize her quad function, return to using the E stim which I strongly support. She states that physical therapy will be able to continue to work with her based on her current insurance.   PCP:  Neena Rhymes, MD  D/C Plans:    Home with HHPT   Post-op Meds:  No Rx given   FYI:  Concerns regarding previous blood clots, on Xarelto already  PMH: Past Medical History  Diagnosis Date  . Basal cell carcinoma   . Fibromyalgia   . Lupus   . RA (rheumatoid arthritis)   . Factor 5 Leiden mutation, heterozygous   . Environmental allergies   . Stroke     1997  . Seizure     1997  . Anxiety   . Pneumonia     hx of 22 years ago   . DVT (deep venous thrombosis)     08/2012 in left leg   . UTI (urinary tract infection)     hx of at age 44   . Anemia     hx of     PSH: Past Surgical History  Procedure Laterality Date  . Tonsillectomy and adenoidectomy    . Knee arthroscopy      left  and to repair   . Ivc filter       Social History:  reports that she has  never smoked. She has never used smokeless tobacco. She reports that  drinks alcohol. She reports that she does not use illicit drugs.  Allergies:  Allergies  Allergen Reactions  . Dilantin (Phenytoin Sodium Extended) Other (See Comments)    Dizziness  . Sulfa Antibiotics Other (See Comments)    Body Stiffness    Medications: No current facility-administered medications for this encounter.   Current Outpatient Prescriptions  Medication Sig Dispense Refill  . cetirizine (ZYRTEC) 10 MG tablet Take 10 mg by mouth daily.      . cholecalciferol (VITAMIN D) 1000 UNITS tablet Take 1,000 Units by mouth daily.      Marland Kitchen HYDROcodone-acetaminophen (NORCO) 10-325 MG per tablet Take 1 tablet by mouth every 4 (four) hours as needed for pain. For pain.      . meloxicam (MOBIC) 7.5 MG tablet Take 1 tablet by mouth 2 (two) times daily.      . Misc Natural Products (LUTEIN 20 PO) Take 20 mg by mouth daily.      . Multiple Vitamin (MULTIVITAMIN WITH MINERALS) TABS Take 1 tablet by mouth daily.      . predniSONE (DELTASONE) 5 MG tablet Take  5 mg by mouth daily. Patient takes a total of 7 mg daily.      . Rivaroxaban (XARELTO) 20 MG TABS Take 20 mg by mouth every evening.         Review of Systems  Constitutional: Positive for malaise/fatigue.  HENT: Negative.   Eyes: Negative.   Respiratory: Negative.   Cardiovascular: Negative.   Gastrointestinal: Negative.   Genitourinary: Negative.   Musculoskeletal: Positive for myalgias and joint pain.  Skin: Negative.   Neurological: Negative.   Endo/Heme/Allergies: Negative.   Psychiatric/Behavioral: Negative.        Physical Exam  Constitutional: She is oriented to person, place, and time and well-developed, well-nourished, and in no distress.  HENT:  Head: Normocephalic and atraumatic.  Mouth/Throat: Oropharynx is clear and moist.  Eyes: Pupils are equal, round, and reactive to light.  Neck: Neck supple. No JVD present. No tracheal deviation  present. No thyromegaly present.  Cardiovascular: Normal rate, regular rhythm and intact distal pulses.   Pulmonary/Chest: Effort normal and breath sounds normal. No stridor. No respiratory distress. She has no wheezes.  Abdominal: Soft. There is no tenderness. There is no guarding.  Musculoskeletal:       Left knee: She exhibits decreased range of motion, swelling, laceration and bony tenderness. She exhibits no effusion, no ecchymosis, no deformity and no erythema. Tenderness found.  Lymphadenopathy:    She has no cervical adenopathy.  Neurological: She is alert and oriented to person, place, and time.  Skin: Skin is warm and dry.  Psychiatric: Affect normal.     Assessment/Plan Assessment:   Left knee stiffness s/p knee scope  Plan: Patient will undergo a left knee closed manipulation under anesthesia on 01/25/2013 per Dr. Charlann Boxer at [facility]. Risks benefits and expectations were discussed with the patient. Patient understand risks, benefits and expectations and wishes to proceed.   Anastasio Auerbach Klare Criss   PAC  01/24/2013, 9:11 PM

## 2013-01-25 ENCOUNTER — Encounter (HOSPITAL_COMMUNITY): Payer: Self-pay | Admitting: *Deleted

## 2013-01-25 ENCOUNTER — Encounter (HOSPITAL_COMMUNITY): Payer: Self-pay | Admitting: Anesthesiology

## 2013-01-25 ENCOUNTER — Encounter (HOSPITAL_COMMUNITY)
Admission: RE | Disposition: A | Discharge status: Home / Self Care | Payer: Self-pay | Source: Ambulatory Visit | Attending: Orthopedic Surgery

## 2013-01-25 ENCOUNTER — Ambulatory Visit (HOSPITAL_COMMUNITY): Payer: Managed Care, Other (non HMO) | Admitting: Anesthesiology

## 2013-01-25 ENCOUNTER — Ambulatory Visit (HOSPITAL_COMMUNITY)
Admission: RE | Admit: 2013-01-25 | Discharge: 2013-01-25 | Disposition: A | Discharge status: Home / Self Care | Payer: Managed Care, Other (non HMO) | Source: Ambulatory Visit | Attending: Orthopedic Surgery | Admitting: Orthopedic Surgery

## 2013-01-25 ENCOUNTER — Encounter: Payer: Managed Care, Other (non HMO) | Admitting: Rehabilitation

## 2013-01-25 DIAGNOSIS — D6859 Other primary thrombophilia: Secondary | ICD-10-CM | POA: Insufficient documentation

## 2013-01-25 DIAGNOSIS — M1990 Unspecified osteoarthritis, unspecified site: Secondary | ICD-10-CM | POA: Insufficient documentation

## 2013-01-25 DIAGNOSIS — Z79899 Other long term (current) drug therapy: Secondary | ICD-10-CM | POA: Insufficient documentation

## 2013-01-25 DIAGNOSIS — Z01812 Encounter for preprocedural laboratory examination: Secondary | ICD-10-CM | POA: Insufficient documentation

## 2013-01-25 DIAGNOSIS — IMO0001 Reserved for inherently not codable concepts without codable children: Secondary | ICD-10-CM | POA: Insufficient documentation

## 2013-01-25 DIAGNOSIS — M069 Rheumatoid arthritis, unspecified: Secondary | ICD-10-CM | POA: Insufficient documentation

## 2013-01-25 DIAGNOSIS — F329 Major depressive disorder, single episode, unspecified: Secondary | ICD-10-CM | POA: Insufficient documentation

## 2013-01-25 DIAGNOSIS — M329 Systemic lupus erythematosus, unspecified: Secondary | ICD-10-CM | POA: Insufficient documentation

## 2013-01-25 DIAGNOSIS — Z8673 Personal history of transient ischemic attack (TIA), and cerebral infarction without residual deficits: Secondary | ICD-10-CM | POA: Insufficient documentation

## 2013-01-25 DIAGNOSIS — D649 Anemia, unspecified: Secondary | ICD-10-CM | POA: Insufficient documentation

## 2013-01-25 DIAGNOSIS — M25569 Pain in unspecified knee: Secondary | ICD-10-CM | POA: Insufficient documentation

## 2013-01-25 DIAGNOSIS — I739 Peripheral vascular disease, unspecified: Secondary | ICD-10-CM | POA: Insufficient documentation

## 2013-01-25 DIAGNOSIS — T8489XA Other specified complication of internal orthopedic prosthetic devices, implants and grafts, initial encounter: Secondary | ICD-10-CM

## 2013-01-25 DIAGNOSIS — F411 Generalized anxiety disorder: Secondary | ICD-10-CM | POA: Insufficient documentation

## 2013-01-25 DIAGNOSIS — F3289 Other specified depressive episodes: Secondary | ICD-10-CM | POA: Insufficient documentation

## 2013-01-25 DIAGNOSIS — M25669 Stiffness of unspecified knee, not elsewhere classified: Secondary | ICD-10-CM | POA: Insufficient documentation

## 2013-01-25 HISTORY — PX: KNEE CLOSED REDUCTION: SHX995

## 2013-01-25 LAB — TYPE AND SCREEN: Antibody Screen: NEGATIVE

## 2013-01-25 SURGERY — MANIPULATION, KNEE, CLOSED
Anesthesia: General | Site: Knee | Laterality: Left | Wound class: Clean

## 2013-01-25 MED ORDER — ACETAMINOPHEN 10 MG/ML IV SOLN: 1000.0000 mg | Freq: Once | INTRAVENOUS | Status: DC | PRN

## 2013-01-25 MED ORDER — OXYCODONE HCL 5 MG/5ML PO SOLN: 5.0000 mg | Freq: Once | ORAL | Status: AC | PRN

## 2013-01-25 MED ORDER — HYDROCODONE-ACETAMINOPHEN 10-325 MG PO TABS: 1.0000 | ORAL_TABLET | ORAL | Status: DC | PRN

## 2013-01-25 MED ORDER — PROMETHAZINE HCL 25 MG/ML IJ SOLN: 6.2500 mg | INTRAMUSCULAR | Status: DC | PRN

## 2013-01-25 MED ORDER — LACTATED RINGERS IV SOLN
INTRAVENOUS | Status: DC | PRN
  Administered 2013-01-25: 16:00:00 via INTRAVENOUS

## 2013-01-25 MED ORDER — LACTATED RINGERS IV SOLN: INTRAVENOUS | Status: DC

## 2013-01-25 MED ORDER — HYDROMORPHONE HCL PF 1 MG/ML IJ SOLN
INTRAMUSCULAR | Status: AC
  Filled 2013-01-25: qty 1

## 2013-01-25 MED ORDER — LACTATED RINGERS IV SOLN
INTRAVENOUS | Status: DC
  Administered 2013-01-25: 1000 mL via INTRAVENOUS

## 2013-01-25 MED ORDER — ONDANSETRON HCL 4 MG/2ML IJ SOLN
INTRAMUSCULAR | Status: DC | PRN
  Administered 2013-01-25 (×2): 2 mg via INTRAVENOUS

## 2013-01-25 MED ORDER — MEPERIDINE HCL 50 MG/ML IJ SOLN: 6.2500 mg | INTRAMUSCULAR | Status: DC | PRN

## 2013-01-25 MED ORDER — HYDROMORPHONE HCL PF 1 MG/ML IJ SOLN
0.2500 mg | INTRAMUSCULAR | Status: DC | PRN
  Administered 2013-01-25: 0.5 mg via INTRAVENOUS

## 2013-01-25 MED ORDER — MIDAZOLAM HCL 5 MG/5ML IJ SOLN
INTRAMUSCULAR | Status: DC | PRN
  Administered 2013-01-25: 1 mg via INTRAVENOUS

## 2013-01-25 MED ORDER — PROPOFOL 10 MG/ML IV EMUL
INTRAVENOUS | Status: DC | PRN
  Administered 2013-01-25: 150 mg via INTRAVENOUS

## 2013-01-25 MED ORDER — FENTANYL CITRATE 0.05 MG/ML IJ SOLN
INTRAMUSCULAR | Status: DC | PRN
  Administered 2013-01-25: 50 ug via INTRAVENOUS

## 2013-01-25 MED ORDER — HYDROMORPHONE HCL PF 1 MG/ML IJ SOLN: 0.2500 mg | INTRAMUSCULAR | Status: DC | PRN

## 2013-01-25 MED ORDER — LIDOCAINE HCL (CARDIAC) 20 MG/ML IV SOLN
INTRAVENOUS | Status: DC | PRN
  Administered 2013-01-25: 30 mg via INTRAVENOUS

## 2013-01-25 MED ORDER — ACETAMINOPHEN 10 MG/ML IV SOLN
INTRAVENOUS | Status: DC | PRN
  Administered 2013-01-25: 500 mg via INTRAVENOUS

## 2013-01-25 MED ORDER — OXYCODONE HCL 5 MG PO TABS
5.0000 mg | ORAL_TABLET | Freq: Once | ORAL | Status: AC | PRN
  Administered 2013-01-25: 5 mg via ORAL
  Filled 2013-01-25: qty 1

## 2013-01-25 MED ORDER — ACETAMINOPHEN 10 MG/ML IV SOLN
INTRAVENOUS | Status: AC
  Filled 2013-01-25: qty 100

## 2013-01-25 SURGICAL SUPPLY — 21 items
BANDAGE ADHESIVE 1X3 (GAUZE/BANDAGES/DRESSINGS) IMPLANT
CLOTH BEACON ORANGE TIMEOUT ST (SAFETY) ×2 IMPLANT
DURAPREP 26ML APPLICATOR (WOUND CARE) IMPLANT
GLOVE BIOGEL PI IND STRL 7.5 (GLOVE) IMPLANT
GLOVE BIOGEL PI IND STRL 8 (GLOVE) IMPLANT
GLOVE BIOGEL PI INDICATOR 7.5 (GLOVE)
GLOVE BIOGEL PI INDICATOR 8 (GLOVE)
GLOVE ECLIPSE 8.0 STRL XLNG CF (GLOVE) IMPLANT
GLOVE ORTHO TXT STRL SZ7.5 (GLOVE) IMPLANT
GLOVE SURG ORTHO 8.0 STRL STRW (GLOVE) IMPLANT
GOWN BRE IMP PREV XXLGXLNG (GOWN DISPOSABLE) IMPLANT
GOWN STRL NON-REIN LRG LVL3 (GOWN DISPOSABLE) IMPLANT
IMMOBILIZER KNEE 20 (SOFTGOODS)
IMMOBILIZER KNEE 20 THIGH 36 (SOFTGOODS) IMPLANT
MANIFOLD NEPTUNE II (INSTRUMENTS) IMPLANT
NDL SAFETY ECLIPSE 18X1.5 (NEEDLE) IMPLANT
NEEDLE HYPO 18GX1.5 SHARP (NEEDLE)
POSITIONER SURGICAL ARM (MISCELLANEOUS) IMPLANT
SPONGE GAUZE 4X4 12PLY (GAUZE/BANDAGES/DRESSINGS) IMPLANT
SYR CONTROL 10ML LL (SYRINGE) IMPLANT
TOWEL OR 17X26 10 PK STRL BLUE (TOWEL DISPOSABLE) IMPLANT

## 2013-01-25 NOTE — Progress Notes (Signed)
Pt not able to wear brace from home d/t mild swelling. Ortho tech in pts rm & applied Immobilizer to pts L knee.

## 2013-01-25 NOTE — Transfer of Care (Signed)
Immediate Anesthesia Transfer of Care Note  Patient: Danielle Villegas  Procedure(s) Performed: Procedure(s): CLOSED MANIPULATION KNEE (Left)  Patient Location: PACU  Anesthesia Type:General  Level of Consciousness: awake, alert , oriented and patient cooperative  Airway & Oxygen Therapy: Patient Spontanous Breathing and Patient connected to face mask oxygen  Post-op Assessment: Report given to PACU RN and Post -op Vital signs reviewed and stable  Post vital signs: stable  Complications: No apparent anesthesia complications

## 2013-01-25 NOTE — Anesthesia Preprocedure Evaluation (Addendum)
Anesthesia Evaluation  Patient identified by MRN, date of birth, ID band Patient awake    Reviewed: Allergy & Precautions, H&P , NPO status , Patient's Chart, lab work & pertinent test results  Airway Mallampati: II TM Distance: >3 FB Neck ROM: Full    Dental  (+) Dental Advisory Given and Teeth Intact   Pulmonary pneumonia -, resolved,  breath sounds clear to auscultation  Pulmonary exam normal       Cardiovascular + Peripheral Vascular Disease negative cardio ROS  Rhythm:Regular Rate:Normal     Neuro/Psych  Headaches, Seizures -,  PSYCHIATRIC DISORDERS Anxiety Depression CVA    GI/Hepatic negative GI ROS, Neg liver ROS,   Endo/Other  negative endocrine ROS  Renal/GU negative Renal ROS     Musculoskeletal  (+) Arthritis -, Osteoarthritis and Rheumatoid disorders,  Fibromyalgia -  Abdominal   Peds  Hematology negative hematology ROS (+) Blood dyscrasia, anemia , Factor V Leiden    Anesthesia Other Findings   Reproductive/Obstetrics negative OB ROS                          Anesthesia Physical Anesthesia Plan  ASA: II  Anesthesia Plan: General   Post-op Pain Management:    Induction: Intravenous  Airway Management Planned: Mask and LMA  Additional Equipment:   Intra-op Plan:   Post-operative Plan:   Informed Consent: I have reviewed the patients History and Physical, chart, labs and discussed the procedure including the risks, benefits and alternatives for the proposed anesthesia with the patient or authorized representative who has indicated his/her understanding and acceptance.   Dental advisory given  Plan Discussed with: CRNA  Anesthesia Plan Comments:        Anesthesia Quick Evaluation

## 2013-01-25 NOTE — Interval H&P Note (Signed)
History and Physical Interval Note:  01/25/2013 4:02 PM  Danielle Villegas  has presented today for surgery, with the diagnosis of Stiff Painful Left Knee  The various methods of treatment have been discussed with the patient and family. After consideration of risks, benefits and other options for treatment, the patient has consented to  Procedure(s): CLOSED MANIPULATION KNEE (Left) as a surgical intervention .  The patient's history has been reviewed, patient examined, no change in status, stable for surgery.  I have reviewed the patient's chart and labs.  Questions were answered to the patient's satisfaction.     Shelda Pal

## 2013-01-25 NOTE — Anesthesia Postprocedure Evaluation (Signed)
Anesthesia Post Note  Patient: Danielle Villegas  Procedure(s) Performed: Procedure(s) (LRB): CLOSED MANIPULATION KNEE (Left)  Anesthesia type: General  Patient location: PACU  Post pain: Pain level controlled  Post assessment: Post-op Vital signs reviewed  Last Vitals:  Filed Vitals:   01/25/13 1700  BP: 160/72  Pulse: 60  Temp: 36.9 C  Resp: 11    Post vital signs: Reviewed  Level of consciousness: sedated  Complications: No apparent anesthesia complications

## 2013-01-25 NOTE — Brief Op Note (Signed)
01/25/2013  4:37 PM  PATIENT:  Danielle Villegas  57 y.o. female  PRE-OPERATIVE DIAGNOSIS:  Stiff Painful Left Knee following extensor mechanism reconstruction  POST-OPERATIVE DIAGNOSIS:  Stiff Painful Left Knee following extensor mechanism reconstruction  PROCEDURE:  Procedure(s): CLOSED MANIPULATION KNEE (Left)  SURGEON:  Surgeon(s) and Role:    * Shelda Pal, MD - Primary  PHYSICIAN ASSISTANT: None  ANESTHESIA:   general  EBL:  Total I/O In: 500 [I.V.:500] Out: -   BLOOD ADMINISTERED:none  DRAINS: none   LOCAL MEDICATIONS USED:  NONE  SPECIMEN:  No Specimen  DISPOSITION OF SPECIMEN:  N/A  COUNTS:  Closed procedure  TOURNIQUET:  * No tourniquets in log *  DICTATION: .Other Dictation: Dictation Number [id number]  PLAN OF CARE: Discharge to home after PACU  PATIENT DISPOSITION:  PACU - hemodynamically stable.   Delay start of Pharmacological VTE agent (>24hrs) due to surgical blood loss or risk of bleeding: no

## 2013-01-26 ENCOUNTER — Encounter (HOSPITAL_COMMUNITY): Payer: Self-pay | Admitting: Orthopedic Surgery

## 2013-01-26 ENCOUNTER — Ambulatory Visit: Payer: Managed Care, Other (non HMO) | Admitting: Physical Therapy

## 2013-01-26 NOTE — Op Note (Signed)
NAMECORBY, VILLASENOR NO.:  [id number]  MEDICAL RECORD NO.:  [id number]  LOCATION:  [facility]                         FACILITY:  [facility]  PHYSICIAN:  Madlyn Frankel. Charlann Boxer, M.D.  DATE OF BIRTH:  04/01/1956  DATE OF PROCEDURE:  01/25/2013 DATE OF DISCHARGE:  01/25/2013                              OPERATIVE REPORT   PREOPERATIVE DIAGNOSES:  Stiff and painful left knee following a repair of the medial patellofemoral ligament for patellar instability.  POSTOPERATIVE DIAGNOSES:  Stiff and painful left knee following a repair of the medial patellofemoral ligament for patellar instability.  PROCEDURE:  Manipulation under anesthesia of the left knee.  SURGEON:  Madlyn Frankel. Charlann Boxer, M.D.  ASSISTANT:  Surgical team.  ANESTHESIA:  General.  SPECIMENS:  None.  COMPLICATIONS:  None.  DRAINS:  None.  FINDINGS:  The patient's preoperative range of motion was passively zero to about 60 degrees or less.  Post manipulation, she maintained her full extension passively and flexed to 130 degrees.  There was no complicating features noted.  See body of operative note discussion for further description of procedure and findings.  INDICATION OF PROCEDURE:  Ms. Streeter is a 57 year old female with recurrent patellar instability, who approximately 3-1/2 months ago underwent an open repair of her medial patellofemoral ligament due to the most recent dislocation of her patella.  She, after period of immobilization, was sent to physical therapy and was failing to progress due to increased pain and stiffness in the knee.  We had a lengthy discussion about options, and I felt that it was important for [place] to consider manipulation to try to maximize her overall outcome.  She still was lacking significant quad function and control.  After reviewing risks of recurrence of her instability due to the manipulation and stretching the soft tissues, complications regarding disruption of the quadriceps  repair as well as issues regarding her underlying medical comorbidities including her history of DVT.  The consent was obtained for improved motion and pain control.  PROCEDURE IN DETAIL:  The patient was brought to the operative theater. Once adequate anesthesia, preoperative antibiotics were not administered, a time-out was performed identifying the patient, planned procedure, and extremity.  I examined her knee, identified passively full extension and flexion only to about 60 degrees.  At this point, due to the fact that she is a very thin lady, I was able to keep my right hand on her patella and applied pressure on the proximal tibia with my left.  I was able to very easily audibly and palpably lyse adhesions with the gentle pressure on the proximal tibia with the hip flexed.  I was able to flex her leg back all the way back to her hamstring, at least to 130 degrees.  All the while maintaining pressure on the patella, I did not feel any complicating features in this area.  Once I finished with this manipulation, there was no evidence of any complicating features.  No palpable defects.  She was then subsequently woken from anesthesia and brought to the recovery room in stable condition.  At this point, Ms. Andrada will plan to go home.  She has her TROM brace that is still locked in extension  to prevent any complicating Features with walking.  She will re-initiate physical therapy tomorrow, work on electrical stimulation, quad function, as well as to try to maintain the motion that was obtained today.  We will also try to get her a home CPM depending on her insurance and qualifications.     Madlyn Frankel Charlann Boxer, M.D.     MDO/MEDQ  D:  01/25/2013  T:  01/26/2013  Job:  [id number]

## 2013-01-27 ENCOUNTER — Ambulatory Visit: Payer: Managed Care, Other (non HMO) | Admitting: Physical Therapy

## 2013-01-28 ENCOUNTER — Ambulatory Visit: Payer: Managed Care, Other (non HMO) | Attending: Orthopedic Surgery | Admitting: Physical Therapy

## 2013-01-28 DIAGNOSIS — M25569 Pain in unspecified knee: Secondary | ICD-10-CM | POA: Insufficient documentation

## 2013-01-28 DIAGNOSIS — IMO0001 Reserved for inherently not codable concepts without codable children: Secondary | ICD-10-CM | POA: Insufficient documentation

## 2013-01-28 DIAGNOSIS — R262 Difficulty in walking, not elsewhere classified: Secondary | ICD-10-CM | POA: Insufficient documentation

## 2013-01-28 DIAGNOSIS — M25669 Stiffness of unspecified knee, not elsewhere classified: Secondary | ICD-10-CM | POA: Insufficient documentation

## 2013-01-29 ENCOUNTER — Ambulatory Visit: Payer: Managed Care, Other (non HMO) | Admitting: Physical Therapy

## 2013-02-01 ENCOUNTER — Ambulatory Visit: Payer: Managed Care, Other (non HMO) | Admitting: Rehabilitation

## 2013-02-03 ENCOUNTER — Ambulatory Visit: Payer: Managed Care, Other (non HMO) | Admitting: Rehabilitation

## 2013-02-04 ENCOUNTER — Ambulatory Visit: Payer: Managed Care, Other (non HMO) | Admitting: Rehabilitation

## 2013-02-05 ENCOUNTER — Ambulatory Visit (INDEPENDENT_AMBULATORY_CARE_PROVIDER_SITE_OTHER): Payer: Managed Care, Other (non HMO) | Admitting: Family Medicine

## 2013-02-05 ENCOUNTER — Encounter: Payer: Self-pay | Admitting: Family Medicine

## 2013-02-05 VITALS — BP 120/70 | HR 102 | Temp 98.4°F | Ht 68.25 in | Wt 141.6 lb

## 2013-02-05 DIAGNOSIS — R030 Elevated blood-pressure reading, without diagnosis of hypertension: Secondary | ICD-10-CM

## 2013-02-05 DIAGNOSIS — F341 Dysthymic disorder: Secondary | ICD-10-CM

## 2013-02-05 DIAGNOSIS — F329 Major depressive disorder, single episode, unspecified: Secondary | ICD-10-CM

## 2013-02-05 NOTE — Patient Instructions (Addendum)
Call [facility] and get some answers Consider the ER if needed Think about counseling Find an outlet- you need a break from this stress Call with any questions or concerns Hang in there!! Happy Mother's Day!!

## 2013-02-05 NOTE — Progress Notes (Signed)
  Subjective:    Patient ID: Danielle Villegas, female    DOB: Oct 27, 1955, 57 y.o.   MRN: [id number]  HPI 'Stressed beyond belief'- continues to have orthopedic issues.  Very aggravated by pain, swelling, lack of mobility.  'it's a huge disappointment'.  'but the most pressing thing right now is my husband'- husband is losing all feeling in his hands.  Has 3 discs in his neck that 'need to come out'.  Was told sxs in the hands may not improve even w/ surgery.  Surgery still not scheduled.  Pt resistant to meds- 'i'm very sensitive'.  Elevated BP- normal today, asymptomatic.  Denies CP, SOB, HAs, visual changes.   Review of Systems For ROS see HPI     Objective:   Physical Exam  Vitals reviewed. Constitutional: She is oriented to person, place, and time. She appears well-developed and well-nourished. She appears distressed (very anxious).  Neurological: She is alert and oriented to person, place, and time.  Psychiatric: Her behavior is normal. Judgment and thought content normal.  Very anxious          Assessment & Plan:

## 2013-02-07 NOTE — Assessment & Plan Note (Signed)
Normal today.  Asymptomatic.  Will continue to follow at upcoming visits.

## 2013-02-07 NOTE — Assessment & Plan Note (Signed)
Deteriorated.  Pt now having stress w/ daughter's mental illness and her and her husband's physical illnesses.  Overwhelmed.  Resistant to meds b/c she has a hx of being sensitive to meds and fears this will cognitively impair her- leaving her unable to drive to appts or care for her family.  Encouraged counseling and attempts at finding a stress outlet.  Will follow.

## 2013-02-08 ENCOUNTER — Ambulatory Visit: Payer: Managed Care, Other (non HMO) | Admitting: Rehabilitation

## 2013-02-10 ENCOUNTER — Ambulatory Visit: Payer: Managed Care, Other (non HMO) | Admitting: Rehabilitation

## 2013-02-11 ENCOUNTER — Ambulatory Visit: Payer: Managed Care, Other (non HMO) | Admitting: Rehabilitation

## 2013-02-12 ENCOUNTER — Encounter: Payer: Managed Care, Other (non HMO) | Admitting: Rehabilitation

## 2013-02-15 ENCOUNTER — Ambulatory Visit: Payer: Managed Care, Other (non HMO) | Admitting: Rehabilitation

## 2013-02-17 ENCOUNTER — Ambulatory Visit: Payer: Managed Care, Other (non HMO) | Admitting: Physical Therapy

## 2013-02-18 ENCOUNTER — Encounter: Payer: Managed Care, Other (non HMO) | Admitting: Rehabilitation

## 2013-02-18 ENCOUNTER — Ambulatory Visit: Payer: Managed Care, Other (non HMO) | Admitting: Rehabilitation

## 2013-02-19 ENCOUNTER — Ambulatory Visit: Payer: Managed Care, Other (non HMO) | Admitting: Rehabilitation

## 2013-02-23 ENCOUNTER — Ambulatory Visit: Payer: Managed Care, Other (non HMO) | Admitting: Rehabilitation

## 2013-02-24 ENCOUNTER — Ambulatory Visit: Payer: Managed Care, Other (non HMO) | Admitting: Rehabilitation

## 2013-02-25 ENCOUNTER — Encounter: Payer: Managed Care, Other (non HMO) | Admitting: Physical Therapy

## 2013-02-25 ENCOUNTER — Ambulatory Visit: Payer: Managed Care, Other (non HMO) | Admitting: Physical Therapy

## 2013-02-26 ENCOUNTER — Ambulatory Visit: Payer: Managed Care, Other (non HMO) | Admitting: Physical Therapy

## 2013-02-26 ENCOUNTER — Encounter: Payer: Self-pay | Admitting: Family Medicine

## 2013-03-01 ENCOUNTER — Ambulatory Visit: Payer: Managed Care, Other (non HMO) | Attending: Orthopedic Surgery | Admitting: Rehabilitation

## 2013-03-01 DIAGNOSIS — R262 Difficulty in walking, not elsewhere classified: Secondary | ICD-10-CM | POA: Insufficient documentation

## 2013-03-01 DIAGNOSIS — IMO0001 Reserved for inherently not codable concepts without codable children: Secondary | ICD-10-CM | POA: Insufficient documentation

## 2013-03-01 DIAGNOSIS — M25669 Stiffness of unspecified knee, not elsewhere classified: Secondary | ICD-10-CM | POA: Insufficient documentation

## 2013-03-01 DIAGNOSIS — M25569 Pain in unspecified knee: Secondary | ICD-10-CM | POA: Insufficient documentation

## 2013-03-03 ENCOUNTER — Ambulatory Visit: Payer: Managed Care, Other (non HMO) | Admitting: Physical Therapy

## 2013-03-04 ENCOUNTER — Ambulatory Visit: Payer: Managed Care, Other (non HMO) | Admitting: Rehabilitation

## 2013-03-05 ENCOUNTER — Telehealth: Payer: Self-pay | Admitting: Family Medicine

## 2013-03-05 NOTE — Telephone Encounter (Signed)
In reference to Rheumatology referral entered 01/10/13, patient was contacted and left messages by [facility]/Dr. Truslow's office, also by me.  I also mailed patient a letter.  As of today, patient will not respond.

## 2013-03-08 ENCOUNTER — Ambulatory Visit: Payer: Managed Care, Other (non HMO) | Admitting: Rehabilitation

## 2013-03-10 ENCOUNTER — Ambulatory Visit: Payer: Managed Care, Other (non HMO) | Admitting: Rehabilitation

## 2013-03-11 ENCOUNTER — Ambulatory Visit: Payer: Managed Care, Other (non HMO) | Admitting: Rehabilitation

## 2013-03-12 ENCOUNTER — Ambulatory Visit: Payer: Managed Care, Other (non HMO) | Admitting: Physical Therapy

## 2013-03-15 ENCOUNTER — Ambulatory Visit: Payer: Managed Care, Other (non HMO) | Admitting: Rehabilitation

## 2013-03-17 ENCOUNTER — Ambulatory Visit: Payer: Managed Care, Other (non HMO) | Admitting: Rehabilitation

## 2013-03-19 ENCOUNTER — Ambulatory Visit: Payer: Managed Care, Other (non HMO) | Admitting: Physical Therapy

## 2013-03-22 ENCOUNTER — Ambulatory Visit: Payer: Managed Care, Other (non HMO) | Admitting: Rehabilitation

## 2013-03-24 ENCOUNTER — Other Ambulatory Visit (HOSPITAL_BASED_OUTPATIENT_CLINIC_OR_DEPARTMENT_OTHER): Payer: Managed Care, Other (non HMO) | Admitting: Lab

## 2013-03-24 ENCOUNTER — Ambulatory Visit: Payer: Managed Care, Other (non HMO) | Admitting: Rehabilitation

## 2013-03-24 ENCOUNTER — Ambulatory Visit (HOSPITAL_BASED_OUTPATIENT_CLINIC_OR_DEPARTMENT_OTHER): Payer: Managed Care, Other (non HMO) | Admitting: Hematology & Oncology

## 2013-03-24 VITALS — BP 159/72 | HR 86 | Temp 98.1°F | Resp 16 | Ht 68.0 in | Wt 143.0 lb

## 2013-03-24 DIAGNOSIS — I824Z2 Acute embolism and thrombosis of unspecified deep veins of left distal lower extremity: Secondary | ICD-10-CM

## 2013-03-24 DIAGNOSIS — I824Z9 Acute embolism and thrombosis of unspecified deep veins of unspecified distal lower extremity: Secondary | ICD-10-CM

## 2013-03-24 LAB — CBC WITH DIFFERENTIAL (CANCER CENTER ONLY)
BASO%: 0.3 % (ref 0.0–2.0)
HCT: 36.3 % (ref 34.8–46.6)
LYMPH#: 0.7 10*3/uL — ABNORMAL LOW (ref 0.9–3.3)
MONO#: 0.6 10*3/uL (ref 0.1–0.9)
NEUT#: 1.8 10*3/uL (ref 1.5–6.5)
NEUT%: 57.1 % (ref 39.6–80.0)
RDW: 13.5 % (ref 11.1–15.7)
WBC: 3.2 10*3/uL — ABNORMAL LOW (ref 3.9–10.0)

## 2013-03-24 LAB — D-DIMER, QUANTITATIVE: D-Dimer, Quant: 0.38 ug/mL-FEU (ref 0.00–0.48)

## 2013-03-24 NOTE — Progress Notes (Signed)
This office note has been dictated.

## 2013-03-25 ENCOUNTER — Ambulatory Visit: Payer: Managed Care, Other (non HMO) | Admitting: Rehabilitation

## 2013-03-25 NOTE — Progress Notes (Signed)
CC:   Lelon Perla, DO Madlyn Frankel Charlann Boxer, M.D.  DIAGNOSES: 1. Deep venous thrombosis of the left posterior tibial vein. 2. Heterozygous mutation for factor V Leiden mutation.  CURRENT THERAPY: 1. The patient to stop Xarelto and go on to aspirin 162 mg p.o. daily. 2. IVC filter in place.  INTERIM HISTORY:  Danielle Villegas comes in for a followup.  Unfortunately, the big problem that she now is having is her husband.  He had emergency cervical spine surgery because of myelopathy developing.  Her husband unfortunately has not regained full function of his hands or arms.  He has a lot of neuropathic pain.  I told her that this just will take time for this to improve.  [name] is having problems with her left knee.  She had surgery for that knee.  She still has very limited mobility.  She sees Dr. Charlann Boxer for this next week.  She has been on Xarelto for 6 months.  I think we can get her off the Xarelto now and get her on to aspirin.  Her last Doppler that was done back in April did not show any evidence of residual DVT in the left leg.  PHYSICAL EXAMINATION:  General:  This is a well-developed, well- nourished white female in no obvious distress.  Vital signs: Temperature of 98.1, pulse 86, respiratory rate 16, blood pressure 159/72.  Weight is 143.  Head and neck:  Normocephalic, atraumatic skull.  There are no ocular or oral lesions.  There are no palpable cervical or supraclavicular lymph nodes.  Lungs:  Clear bilaterally. Cardiac:  Regular rate and rhythm with a normal S1 and S2.  There are no murmurs, rubs or bruits.  Abdomen:  Soft with good bowel sounds.  There is no palpable abdominal mass.  There is no palpable hepatosplenomegaly. Extremities:  Show no clubbing, cyanosis, or edema.  She has some slight swelling of the left knee.  Her surgical scar is well-healed.  She does have limited range of motion of the left leg.  No palpable venous cord is noted in the left leg.   Skin exam shows no rashes, ecchymosis, or petechia.  LABORATORY STUDIES:  White cell count is 3.2, hemoglobin 11.8, hematocrit 36.3, platelet count 170.  IMPRESSION:  Ms. Danielle Villegas is a very nice 57 year old white female with heterozygous factor V Leiden mutation.  She had a deep venous thrombosis in the left leg after a traumatic fall and immobilization of the knee. The left last D-dimer was normal at 0.27.  This was in April. Again, we will get her on aspirin.  She would like to keep the filter in.  I do not have any problems with this.  We will plan to get her back to see [place] in about 2 months' time.  I do not see a need for any repeat Dopplers or x-rays.    ______________________________ Josph Macho, M.D. PRE/MEDQ  D:  03/24/2013  T:  03/25/2013  Job:  [id number]

## 2013-03-26 ENCOUNTER — Ambulatory Visit: Payer: Managed Care, Other (non HMO) | Admitting: Physical Therapy

## 2013-03-29 ENCOUNTER — Ambulatory Visit: Payer: Managed Care, Other (non HMO) | Admitting: Physical Therapy

## 2013-03-30 ENCOUNTER — Other Ambulatory Visit: Payer: Self-pay | Admitting: Family Medicine

## 2013-03-31 ENCOUNTER — Other Ambulatory Visit: Payer: Self-pay | Admitting: Orthopedic Surgery

## 2013-03-31 ENCOUNTER — Ambulatory Visit: Payer: Managed Care, Other (non HMO) | Admitting: Rehabilitation

## 2013-03-31 DIAGNOSIS — M25562 Pain in left knee: Secondary | ICD-10-CM

## 2013-04-01 ENCOUNTER — Ambulatory Visit: Payer: Managed Care, Other (non HMO) | Attending: Orthopedic Surgery | Admitting: Physical Therapy

## 2013-04-01 DIAGNOSIS — M25569 Pain in unspecified knee: Secondary | ICD-10-CM | POA: Insufficient documentation

## 2013-04-01 DIAGNOSIS — IMO0001 Reserved for inherently not codable concepts without codable children: Secondary | ICD-10-CM | POA: Insufficient documentation

## 2013-04-01 DIAGNOSIS — M25669 Stiffness of unspecified knee, not elsewhere classified: Secondary | ICD-10-CM | POA: Insufficient documentation

## 2013-04-01 DIAGNOSIS — R262 Difficulty in walking, not elsewhere classified: Secondary | ICD-10-CM | POA: Insufficient documentation

## 2013-04-01 NOTE — Telephone Encounter (Signed)
Per last OV note.  Refill done.

## 2013-04-05 ENCOUNTER — Ambulatory Visit: Payer: Managed Care, Other (non HMO) | Admitting: Physical Therapy

## 2013-04-07 ENCOUNTER — Ambulatory Visit: Payer: Managed Care, Other (non HMO) | Admitting: Physical Therapy

## 2013-04-08 ENCOUNTER — Ambulatory Visit
Admission: RE | Admit: 2013-04-08 | Discharge: 2013-04-08 | Disposition: A | Discharge status: Home / Self Care | Payer: Managed Care, Other (non HMO) | Source: Ambulatory Visit | Attending: Orthopedic Surgery | Admitting: Orthopedic Surgery

## 2013-04-08 DIAGNOSIS — M25562 Pain in left knee: Secondary | ICD-10-CM

## 2013-04-09 ENCOUNTER — Ambulatory Visit: Payer: Managed Care, Other (non HMO) | Admitting: Rehabilitation

## 2013-04-15 ENCOUNTER — Telehealth: Payer: Self-pay | Admitting: Family Medicine

## 2013-04-15 ENCOUNTER — Other Ambulatory Visit: Payer: Self-pay

## 2013-04-15 NOTE — Telephone Encounter (Addendum)
Patient states that her [facility] home [facility] needs for her Prednisone 1mg  script to be revised and sent to them. She says that the quantity on her Prednisone 1mg  needs to be changed to quantity of 180 tablets so that she can take 7mg  daily. Please advise.

## 2013-04-15 NOTE — Telephone Encounter (Signed)
Called [facility] and spoke with Herbert Seta (Pharmacist) and gave the new amount for the prescription. She took a verbal on the 1mg  and 5mg  so that she could get those sent out at the same time. Advised patient that they now had the Rx and all straightened out. GF/RN

## 2013-06-18 ENCOUNTER — Ambulatory Visit (INDEPENDENT_AMBULATORY_CARE_PROVIDER_SITE_OTHER): Payer: Managed Care, Other (non HMO) | Admitting: Family Medicine

## 2013-06-18 ENCOUNTER — Encounter: Payer: Self-pay | Admitting: Family Medicine

## 2013-06-18 VITALS — BP 130/84 | HR 73 | Temp 98.2°F | Ht 68.25 in | Wt 149.6 lb

## 2013-06-18 DIAGNOSIS — Z0000 Encounter for general adult medical examination without abnormal findings: Secondary | ICD-10-CM

## 2013-06-18 DIAGNOSIS — Z01419 Encounter for gynecological examination (general) (routine) without abnormal findings: Secondary | ICD-10-CM

## 2013-06-18 LAB — CBC WITH DIFFERENTIAL/PLATELET
Basophils Relative: 0.7 % (ref 0.0–3.0)
Eosinophils Relative: 0.7 % (ref 0.0–5.0)
Hemoglobin: 12.8 g/dL (ref 12.0–15.0)
Lymphocytes Relative: 20.6 % (ref 12.0–46.0)
Monocytes Relative: 14.9 % — ABNORMAL HIGH (ref 3.0–12.0)
Neutro Abs: 2.5 10*3/uL (ref 1.4–7.7)
RBC: 4.2 Mil/uL (ref 3.87–5.11)
WBC: 4 10*3/uL — ABNORMAL LOW (ref 4.5–10.5)

## 2013-06-18 LAB — POCT URINALYSIS DIPSTICK
Bilirubin, UA: NEGATIVE
Blood, UA: NEGATIVE
Glucose, UA: NEGATIVE
Leukocytes, UA: NEGATIVE
Nitrite, UA: NEGATIVE
Urobilinogen, UA: 0.2
pH, UA: 6.5

## 2013-06-18 LAB — LIPID PANEL
Cholesterol: 227 mg/dL — ABNORMAL HIGH (ref 0–200)
Total CHOL/HDL Ratio: 3

## 2013-06-18 LAB — HEPATIC FUNCTION PANEL
ALT: 21 U/L (ref 0–35)
AST: 23 U/L (ref 0–37)
Total Protein: 6.8 g/dL (ref 6.0–8.3)

## 2013-06-18 LAB — BASIC METABOLIC PANEL
BUN: 23 mg/dL (ref 6–23)
CO2: 31 mEq/L (ref 19–32)
Chloride: 106 mEq/L (ref 96–112)
Creatinine, Ser: 0.9 mg/dL (ref 0.4–1.2)
Potassium: 3.5 mEq/L (ref 3.5–5.1)

## 2013-06-18 LAB — TSH: TSH: 2.2 u[IU]/mL (ref 0.35–5.50)

## 2013-06-18 NOTE — Progress Notes (Signed)
  Subjective:    Patient ID: Danielle Villegas, female    DOB: [birth year], 57 y.o.   MRN: [id number]  HPI CPE- UTD on mammo, pt declines DEXA.  Due for colonoscopy next year.  Pap due next year- plans to see GYN ([facility])   Review of Systems Patient reports no vision/ hearing changes, adenopathy,fever, weight change,  persistant/recurrent hoarseness , swallowing issues, chest pain, palpitations, edema, persistant/recurrent cough, hemoptysis, dyspnea (rest/exertional/paroxysmal nocturnal), gastrointestinal bleeding (melena, rectal bleeding), abdominal pain, significant heartburn, bowel changes, GU symptoms (dysuria, hematuria, incontinence), Gyn symptoms (abnormal  bleeding, pain),  syncope, focal weakness, memory loss, numbness & tingling, skin/hair/nail changes, abnormal bruising or bleeding, anxiety, or depression.     Objective:   Physical Exam General Appearance:    Alert, cooperative, no distress, appears stated age  Head:    Normocephalic, without obvious abnormality, atraumatic  Eyes:    PERRL, conjunctiva/corneas clear, EOM's intact, fundi    benign, both eyes  Ears:    Normal TM's and external ear canals, both ears  Nose:   Nares normal, septum midline, mucosa normal, no drainage    or sinus tenderness  Throat:   Lips, mucosa, and tongue normal; teeth and gums normal  Neck:   Supple, symmetrical, trachea midline, no adenopathy;    Thyroid: no enlargement/tenderness/nodules  Back:     Symmetric, no curvature, ROM normal, no CVA tenderness  Lungs:     Clear to auscultation bilaterally, respirations unlabored  Chest Wall:    No tenderness or deformity   Heart:    Regular rate and rhythm, S1 and S2 normal, no murmur, rub   or gallop  Breast Exam:    Deferred to GYN  Abdomen:     Soft, non-tender, bowel sounds active all four quadrants,    no masses, no organomegaly  Genitalia:    Deferred to GYN  Rectal:    Extremities:   Extremities normal, atraumatic, no cyanosis or edema   Pulses:   2+ and symmetric all extremities  Skin:   Skin color, texture, turgor normal, no rashes or lesions  Lymph nodes:   Cervical, supraclavicular, and axillary nodes normal  Neurologic:   CNII-XII intact, normal strength, sensation and reflexes    throughout          Assessment & Plan:

## 2013-06-18 NOTE — Assessment & Plan Note (Signed)
Pt's PE WNL.  UTD on pap, mammo, colonoscopy.  Check labs.  Anticipatory guidance provided.  

## 2013-06-18 NOTE — Patient Instructions (Addendum)
We'll notify you of your lab results and make any changes if needed We'll call you with your GYN appt Call with any questions or concerns Keep up the good work! Happy Fall!

## 2013-06-22 ENCOUNTER — Other Ambulatory Visit: Payer: Self-pay | Admitting: Family Medicine

## 2013-06-22 LAB — VITAMIN D 1,25 DIHYDROXY
Vitamin D2 1, 25 (OH)2: <8 pg/mL
Vitamin D3 1, 25 (OH)2: 45 pg/mL

## 2013-06-23 ENCOUNTER — Other Ambulatory Visit (HOSPITAL_BASED_OUTPATIENT_CLINIC_OR_DEPARTMENT_OTHER): Payer: Managed Care, Other (non HMO) | Admitting: Lab

## 2013-06-23 ENCOUNTER — Ambulatory Visit (HOSPITAL_BASED_OUTPATIENT_CLINIC_OR_DEPARTMENT_OTHER): Payer: Managed Care, Other (non HMO) | Admitting: Hematology & Oncology

## 2013-06-23 VITALS — BP 151/47 | HR 69 | Temp 98.3°F | Resp 14 | Ht 68.0 in | Wt 149.0 lb

## 2013-06-23 DIAGNOSIS — I824Z9 Acute embolism and thrombosis of unspecified deep veins of unspecified distal lower extremity: Secondary | ICD-10-CM

## 2013-06-23 DIAGNOSIS — I824Z2 Acute embolism and thrombosis of unspecified deep veins of left distal lower extremity: Secondary | ICD-10-CM

## 2013-06-23 LAB — CBC WITH DIFFERENTIAL (CANCER CENTER ONLY)
BASO%: 0.3 % (ref 0.0–2.0)
EOS%: 2.9 % (ref 0.0–7.0)
Eosinophils Absolute: 0.1 10*3/uL (ref 0.0–0.5)
LYMPH%: 20.2 % (ref 14.0–48.0)
MCH: 30.9 pg (ref 26.0–34.0)
MCV: 96 fL (ref 81–101)
MONO%: 13.9 % — ABNORMAL HIGH (ref 0.0–13.0)
NEUT#: 2.4 10*3/uL (ref 1.5–6.5)
Platelets: 170 10*3/uL (ref 145–400)
RBC: 3.98 10*6/uL (ref 3.70–5.32)
RDW: 13.2 % (ref 11.1–15.7)

## 2013-06-23 LAB — D-DIMER, QUANTITATIVE: D-Dimer, Quant: 0.42 ug/mL-FEU (ref 0.00–0.48)

## 2013-06-23 NOTE — Progress Notes (Signed)
This office note has been dictated.

## 2013-06-24 ENCOUNTER — Other Ambulatory Visit: Payer: Self-pay | Admitting: *Deleted

## 2013-06-24 DIAGNOSIS — M359 Systemic involvement of connective tissue, unspecified: Secondary | ICD-10-CM

## 2013-06-24 NOTE — Telephone Encounter (Signed)
Rx filled for 1 month. Referral for rheumatologist made. SW, CMA

## 2013-06-25 NOTE — Telephone Encounter (Signed)
Patient is calling upset because we only sent a 1 month supply of Prednisone to her pharmacy. States that we have to send it for 90 days for insurance purposes. She was also upset because she received a phone call to make an appointment with a rheumatologist. She states that she does not want to see a rheumatologist because they might take her off of her Prednisone and she does not want to come off of it.

## 2013-06-25 NOTE — Telephone Encounter (Signed)
Please advise. SW, CMA

## 2013-06-25 NOTE — Telephone Encounter (Signed)
Unfortunately, I do not treat autoimmune conditions nor prescribe maintenance prednisone- as this carries serious side effects.  She will need to see rheumatology or stop the prednisone

## 2013-06-28 ENCOUNTER — Other Ambulatory Visit: Payer: Self-pay | Admitting: *Deleted

## 2013-06-28 NOTE — Telephone Encounter (Signed)
Called [facility] pharmacy to inquire to the least amt of prednisone that could be sent in to be covered by insurance. Pharmacist not available after 20 minutes of hold time, left message to return our call.

## 2013-06-28 NOTE — Telephone Encounter (Signed)
I understand and can appreciate what she is saying but it doesn't change the fact that it is not medically appropriate for me to continue prescribing prednisone for an autoimmune condition that I don't treat.  Willing to refer to any rheumatologist pt wishes but cannot continue the prednisone.

## 2013-06-28 NOTE — Telephone Encounter (Signed)
Spoke with pt and she stated that she has been taking this medication for 18 years with no side effects. Pt states she is feeling fine has no symptoms. Not willing to try something new. Still upset that med was only sent in for a 90 day supply to a mail order. They will not fill the 30 day supply.

## 2013-06-28 NOTE — Telephone Encounter (Signed)
Called and LM with pt's husband to return call.

## 2013-06-29 NOTE — Progress Notes (Signed)
CC:   Danielle Villegas, M.D.  DIAGNOSES: 1. Factor V Leiden mutation, heterozygote. 2. Deep vein thrombosis of the left posterior tibial vein.  CURRENT THERAPY: 1. IVC filter in place. 2. Aspirin 162 mg p.o. daily.  INTERIM HISTORY:  [name] comes in for a followup.  She is not walking in.  She really looks incredibly well.  She has done very nicely.  She had much better range of motion of her left knee, which was operated on.  She says she got some steroid injections, which helped immensely.  Unfortunately, her husband is having problems with myelopathic hand issues.  He had a cord compression from a herniated disk in his neck. He has still not recovered from this.  [name] has the IVC filter in place.  She still wants to keep this in.  She is worried about another blood clot developing.  She is doing well with aspirin.  She has had no problems with bleeding.  There is no abdominal pain.  She did have a Doppler back in April.  Everything looked fine with no evidence of residual DVT in the left leg.  She has had no headache.  There has been no weight loss or weight gain. She has had no cough or shortness of breath.  PHYSICAL EXAMINATION:  General:  This is a well-developed, well- nourished white female in no obvious distress.  Vital signs: Temperature of 98.3, pulse 69, respiratory rate 14, blood pressure 151/47.  Weight is 149 pounds.  Head and Neck:  Normocephalic, atraumatic skull.  There are no ocular or oral lesions.  There are no palpable cervical or supraclavicular lymph nodes.  Lungs:  Clear bilaterally.  Cardiac:  Regular rate and rhythm with a normal S1, S2. There are no murmurs, rubs or bruits.  Abdomen:  Soft.  She has good bowel sounds.  There is no fluid wave.  There is no palpable hepatosplenomegaly.  Back:  No tenderness over the spine, ribs, or hips. Extremities:  No clubbing, cyanosis or edema.  She has decent range of motion of the left knee.   There is no palpable venous cord in her legs. She has good pulses in her distal extremities.  Neurological:  No focal neurological deficits.  Skin:  No rashes, ecchymosis, or petechiae.  LABORATORY STUDIES:  White cell count 3.8, hemoglobin 12.3, hematocrit 38.1, platelet count 170.  D-dimer 0.42.  IMPRESSION:  [name] is a very charming 57 year old white female. She has a factor V Leiden mutation.  She is heterozygous for this.  I think her last blood clot was probably 16 years ago.  The most recent clot she had after following and traumatizing her left leg.  She was on Xarelto for 6 months.  We got her onto aspirin back in June of this year.  Again, we talked about the IVC filter.  She still wants to keep it in place.  I do not see any problems with her keeping this in place.  I think we can probably get her back in 3 months' time.  I do not see a need for any x-ray studies that need to be done.    ______________________________ Josph Macho, M.D. PRE/MEDQ  D:  06/23/2013  T:  06/29/2013  Job:  [id number]

## 2013-06-29 NOTE — Telephone Encounter (Signed)
Pharmacist returned my call and stated that they could fill a 30, 60 or 90 day of any script. Pt was concerned that they would not fill a 30 day supply, lmovm to inform pt.

## 2013-08-05 ENCOUNTER — Other Ambulatory Visit: Payer: Self-pay

## 2013-08-30 ENCOUNTER — Ambulatory Visit: Payer: Managed Care, Other (non HMO) | Admitting: Family Medicine

## 2013-09-06 NOTE — Progress Notes (Signed)
CC:   Neena Rhymes, M.D.  DIAGNOSES: 1. Factor V Leiden mutation - heterozygote. 2. Deep vein thrombosis of the left posterior tibial vein.  CURRENT THERAPY: 1. IVC filter in place. 2. Aspirin 162 mg p.o. daily.  INTERIM HISTORY:  Mr. Buckholtz comes in for followup.  She is now walking in.  She looks incredibly well.  She has done very nicely.  She has had much better range of motion of her left knee, which was operated on.  She states she got some steroid injections which helped immensely. Unfortunately, her husband still is having problems with myelopathic hand issues.  He had cord compression from a herniated disk in his neck. He has still not recovered from this.  Ms. Osland has the IVC filter in place.  She still wants to keep this in.  She is worried about another blood clot developing.  She is doing well with aspirin.  She has had no problems with bleeding. There is no abdominal pain.  She did have a Doppler back in April.  Everything looked fine with no evidence of residual DVT in the left leg.  She has had no headache.  There has been no weight loss or weight gain. She has had no cough or shortness of breath.  PHYSICAL EXAMINATION:  General:  This is a well-developed, well- nourished white female in no obvious distress.  Vital Signs: Temperature of 98.3, pulse 69, respiratory rate 14, blood pressure 151/47.  Weight is 149 pounds.  Head and neck:  Normocephalic, atraumatic skull.  There are no ocular or oral lesions.  There are no palpable cervical or supraclavicular lymph nodes.  Lungs:  Clear bilaterally.  Cardiac:  Regular rate and rhythm with a normal S1 and S2. There are no murmurs, rubs, or bruits.  Abdomen:  Soft.  She has good bowel sounds.  There is no fluid wave.  There is no palpable hepatosplenomegaly.  Back:  No tenderness over the spine, ribs, or hips. Extremities:  No clubbing, cyanosis,  or edema.  She has decent range of motion of the left  knee.  There are no palpable venous cord in her legs. She has good pulses in her distal extremities.  Neurologic:  No focal neurological deficits.  Skin:  No rashes, ecchymosis, or petechia.  LABORATORIES STUDIES:  White cell count 3.8, hemoglobin 12.3, hematocrit 38.1, platelet count 170.  D-dimer is 0.42.  IMPRESSION:  Ms. Steinert is a very charming 57 year old white female. She has a factor V Leiden mutation.  She is heterozygous for this.  I think her last blood clot was probably 16 years ago.  The most recent clot she had after falling and traumatizing her left leg.  She was on Xarelto for 6 months.  We got her on to aspirin back in June 2014.  Again, we talked about the IVC filter.  She still wants to keep it in place.  I do not see any problems with her keeping this in place.  I think we will probably get her back in 3 months' time.  I do not see a need for any x-ray studies that need to be done.    ______________________________ Josph Macho, M.D. PRE/MEDQ  D:  06/23/2013  T:  09/05/2013  Job:  [id number]

## 2013-09-08 ENCOUNTER — Telehealth: Payer: Self-pay | Admitting: Hematology & Oncology

## 2013-09-08 NOTE — Telephone Encounter (Signed)
Patient called and cx 09/15/13 apt and resch for 09/24/13

## 2013-09-15 ENCOUNTER — Other Ambulatory Visit: Payer: Managed Care, Other (non HMO) | Admitting: Lab

## 2013-09-15 ENCOUNTER — Ambulatory Visit: Payer: Managed Care, Other (non HMO) | Admitting: Hematology & Oncology

## 2013-09-24 ENCOUNTER — Other Ambulatory Visit (HOSPITAL_BASED_OUTPATIENT_CLINIC_OR_DEPARTMENT_OTHER): Payer: Managed Care, Other (non HMO)

## 2013-09-24 ENCOUNTER — Other Ambulatory Visit: Payer: Managed Care, Other (non HMO) | Admitting: Lab

## 2013-09-24 ENCOUNTER — Ambulatory Visit (HOSPITAL_BASED_OUTPATIENT_CLINIC_OR_DEPARTMENT_OTHER): Payer: Managed Care, Other (non HMO) | Admitting: Hematology & Oncology

## 2013-09-24 ENCOUNTER — Telehealth: Payer: Self-pay | Admitting: Hematology & Oncology

## 2013-09-24 VITALS — BP 168/88 | HR 79 | Temp 97.9°F | Resp 16 | Wt 148.0 lb

## 2013-09-24 DIAGNOSIS — D509 Iron deficiency anemia, unspecified: Secondary | ICD-10-CM

## 2013-09-24 DIAGNOSIS — I824Z2 Acute embolism and thrombosis of unspecified deep veins of left distal lower extremity: Secondary | ICD-10-CM

## 2013-09-24 DIAGNOSIS — R5383 Other fatigue: Secondary | ICD-10-CM

## 2013-09-24 DIAGNOSIS — Z86718 Personal history of other venous thrombosis and embolism: Secondary | ICD-10-CM

## 2013-09-24 DIAGNOSIS — R5381 Other malaise: Secondary | ICD-10-CM

## 2013-09-24 DIAGNOSIS — R413 Other amnesia: Secondary | ICD-10-CM

## 2013-09-24 LAB — CBC WITH DIFFERENTIAL (CANCER CENTER ONLY)
BASO%: 0.2 % (ref 0.0–2.0)
EOS%: 3.5 % (ref 0.0–7.0)
HCT: 36.9 % (ref 34.8–46.6)
LYMPH#: 0.7 10*3/uL — ABNORMAL LOW (ref 0.9–3.3)
MCHC: 32.2 g/dL (ref 32.0–36.0)
MONO#: 0.9 10*3/uL (ref 0.1–0.9)
NEUT#: 2.6 10*3/uL (ref 1.5–6.5)
NEUT%: 59.5 % (ref 39.6–80.0)
Platelets: 174 10*3/uL (ref 145–400)
RDW: 13.7 % (ref 11.1–15.7)
WBC: 4.3 10*3/uL (ref 3.9–10.0)

## 2013-09-24 LAB — FERRITIN CHCC: Ferritin: 199 ng/ml (ref 9–269)

## 2013-09-24 LAB — IRON AND TIBC CHCC: %SAT: 22 % (ref 21–57)

## 2013-09-24 LAB — RETICULOCYTES (CHCC): Retic Ct Pct: 1.3 % (ref 0.4–2.3)

## 2013-09-24 NOTE — Telephone Encounter (Signed)
Mailed feb. schedule °

## 2013-09-24 NOTE — Progress Notes (Signed)
This office note has been dictated.

## 2013-09-25 LAB — D-DIMER, QUANTITATIVE: D-Dimer, Quant: 0.27 ug/mL-FEU (ref 0.00–0.48)

## 2013-09-25 NOTE — Progress Notes (Signed)
CC:   Neena Rhymes, M.D.  DIAGNOSES: 1. Factor V Leiden mutation - heterozygote. 2. Deep venous thrombosis of the left posterior tibial vein. 3. Severe fatigue.  CURRENT THERAPY:  Aspirin 162 mg p.o. daily.  INTERIM HISTORY:  Danielle Villegas comes in for followup.  She is very tired.  She has recovered well from her knee surgery.  She is working out.  She has got some injections into her knee.  Again, she feels very tired and fatigued.  She has not noticed any kind of problems with bleeding.  Her weight has been holding steady.  Her appetite has been doing fairly good.  She says that her skin is dry. There may be some hair loss.  We will have to check her thyroid and iron levels to see what they are.  She has had no change in bowel or bladder habits.  There has been no leg swelling.  She has had no rashes.  She has had no cough there.  PHYSICAL EXAMINATION:  General:  This is a well-developed, well- nourished white female, in no obvious distress.  Vital Signs: Temperature of 97.9, pulse is 79, respiratory rate 18, blood pressure 168/88.  Weight is 148 pounds.  Head and Neck:  Normocephalic, atraumatic skull.  There are no ocular or oral lesions.  There are no palpable cervical or supraclavicular lymph nodes.  Lungs:  Clear bilaterally.  Cardiac:  Regular rate and rhythm with a normal S1, S2. There are no murmurs, rubs, or bruits.  Abdomen:  Soft.  She has good bowel sounds.  There is no fluid wave.  There is no palpable abdominal mass.  There is no palpable hepatosplenomegaly.  Extremities:  No clubbing, cyanosis, or edema.  Neurologic:  No focal neurological deficits.  LABORATORY STUDIES:  White cell count is 4.3, hemoglobin 11.9, hematocrit 36.9, platelet count 174.  MCV is 96.  On her peripheral smear, there is some slight anisocytosis.  There is some slight microcytic red cells.  She has had no nucleated red cells. There is no target cells.  White cells appeared  normal morphology maturation.  There are no hypersegmented polys.  There are no blasts. Platelets are adequate in number and size.  IMPRESSION:  Danielle Villegas is a very charming 57 year old white female. She has history of deep venous thrombosis of the left posterior tibial vein.  She was on anticoagulation with Xarelto.  We had her on 6 months for this.  She completed this back in June 2014.  We now have on aspirin.  Of note, she still has an IVC filter in place.  She wants to keep this in place.  We will check her thyroid function.  We will also check her iron studies to see if this might be the reason for her fatigue.  I will go ahead and plan to get back in another couple of months, so we can see how she is doing.    ______________________________ Josph Macho, M.D. PRE/MEDQ  D:  09/24/2013  T:  09/25/2013  Job:  [id number]

## 2013-09-27 ENCOUNTER — Other Ambulatory Visit: Payer: Self-pay | Admitting: Oncology

## 2013-09-27 ENCOUNTER — Telehealth: Payer: Self-pay | Admitting: Hematology & Oncology

## 2013-09-27 ENCOUNTER — Telehealth: Payer: Self-pay | Admitting: Oncology

## 2013-09-27 DIAGNOSIS — D509 Iron deficiency anemia, unspecified: Secondary | ICD-10-CM

## 2013-09-27 NOTE — Telephone Encounter (Signed)
Pt aware of 12-31 iron Danielle Villegas aware to precert

## 2013-09-27 NOTE — Telephone Encounter (Addendum)
Message copied by Lacie Draft on Mon Sep 27, 2013  9:15 AM ------      Message from: Josph Macho      Created: Sun Sep 26, 2013  8:08 PM       Call - iron is a little low!! Please set up Feraheme  1020mg  x 1 dose. Thanks!! Loyal Jacobson with patient, will set up with [facility]. ------

## 2013-09-29 ENCOUNTER — Encounter: Payer: Self-pay | Admitting: Nurse Practitioner

## 2013-09-29 ENCOUNTER — Ambulatory Visit (HOSPITAL_BASED_OUTPATIENT_CLINIC_OR_DEPARTMENT_OTHER): Payer: Managed Care, Other (non HMO)

## 2013-09-29 VITALS — BP 150/78 | HR 77 | Temp 98.0°F | Resp 18

## 2013-09-29 DIAGNOSIS — D509 Iron deficiency anemia, unspecified: Secondary | ICD-10-CM

## 2013-09-29 MED ORDER — SODIUM CHLORIDE 0.9 % IV SOLN
Freq: Once | INTRAVENOUS | Status: AC
  Administered 2013-09-29: 13:00:00 via INTRAVENOUS

## 2013-09-29 MED ORDER — SODIUM CHLORIDE 0.9 % IV SOLN
1020.0000 mg | Freq: Once | INTRAVENOUS | Status: AC
  Administered 2013-09-29: 1020 mg via INTRAVENOUS
  Filled 2013-09-29: qty 34

## 2013-09-29 NOTE — Patient Instructions (Addendum)
Iron Deficiency Anemia There are many types of anemia. Iron deficiency anemia is the most common. Iron deficiency anemia is a decrease in the number of red blood cells caused by too little iron. Without enough iron, your body does not produce enough hemoglobin. Hemoglobin is a substance in red blood cells that carries oxygen to the body's tissues. Iron deficiency anemia may leave you tired and short of breath. CAUSES   Lack of iron in the diet.  This may be seen in infants and children, because there is little iron in milk.  This may be seen in adults who do not eat enough iron-rich foods.  This may be seen in pregnant or breastfeeding women who do not take iron supplements. There is a much higher need for iron intake at these times.  Poor absorption of iron, as seen with intestinal disorders.  Intestinal bleeding.  Heavy periods. SYMPTOMS  Mild anemia may not be noticeable. Symptoms may include:  Fatigue.  Headache.  Pale skin.  Weakness.  Shortness of breath.  Dizziness.  Cold hands and feet.  Fast or irregular heartbeat. DIAGNOSIS  Diagnosis requires a thorough evaluation and physical exam by your caregiver.  Blood tests are generally used to confirm iron deficiency anemia.  Additional tests may be done to find the underlying cause of your anemia. These may include:  Testing for blood in the stool (fecal occult blood test).  A procedure to see inside the colon and rectum (colonoscopy).  A procedure to see inside the esophagus and stomach (endoscopy). TREATMENT   Correcting the cause of the iron deficiency is the first step.  Medicines, such as oral contraceptives, can make heavy menstrual flows lighter.  Antibiotics and other medicines can be used to treat peptic ulcers.  Surgery may be needed to remove a bleeding polyp, tumor, or fibroid.  Often, iron supplements (ferrous sulfate) are taken.  For the best iron absorption, take these supplements with an  empty stomach.  You may need to take the supplements with food if you cannot tolerate them on an empty stomach. Vitamin C improves the absorption of iron. Your caregiver may recommend taking your iron tablets with a glass of orange juice or vitamin C supplement.  Milk and antacids should not be taken at the same time as iron supplements. They may interfere with the absorption of iron.  Iron supplements can cause constipation. A stool softener is often recommended.  Pregnant and breastfeeding women will need to take extra iron, because their normal diet usually will not provide the required amount.  Patients who cannot tolerate iron by mouth can take it through a vein (intravenously) or by an injection into the muscle. HOME CARE INSTRUCTIONS   Ask your dietitian for help with diet questions.  Take iron and vitamins as directed by your caregiver.  Eat a diet rich in iron. Eat liver, lean beef, whole-grain bread, eggs, dried fruit, and dark green leafy vegetables. SEEK IMMEDIATE MEDICAL CARE IF:   You have a fainting episode. Do not drive yourself. Call your local emergency services (911 in [place] if no other help is available.  You have chest pain, nausea, or vomiting.  You develop severe or increased shortness of breath with activities.  You develop weakness or increased thirst.  You have a rapid heartbeat.  You develop unexplained sweating or become lightheaded when getting up from a chair or bed. MAKE SURE YOU:   Understand these instructions.  Will watch your condition.  Will get help right away   if you are not doing well or get worse. Document Released: 09/13/2000 Document Revised: 12/09/2011 Document Reviewed: 01/23/2010 [facility] Patient Information 2014 [place], [place].   Ferumoxytol injection What is this medicine? FERUMOXYTOL is an iron complex. Iron is used to make healthy red blood cells, which carry oxygen and nutrients throughout the body. This medicine is used to  treat iron deficiency anemia in people with chronic kidney disease. This medicine may be used for other purposes; ask your health care provider or pharmacist if you have questions. COMMON BRAND NAME(S): Feraheme  What should I tell my health care provider before I take this medicine? They need to know if you have any of these conditions: -anemia not caused by low iron levels -high levels of iron in the blood -magnetic resonance imaging (MRI) test scheduled -an unusual or allergic reaction to iron, other medicines, foods, dyes, or preservatives -pregnant or trying to get pregnant -breast-feeding How should I use this medicine? This medicine is for injection into a vein. It is given by a health care professional in a hospital or clinic setting. Talk to your pediatrician regarding the use of this medicine in children. Special care may be needed. Overdosage: If you think you've taken too much of this medicine contact a poison control center or emergency room at once. Overdosage: If you think you have taken too much of this medicine contact a poison control center or emergency room at once. NOTE: This medicine is only for you. Do not share this medicine with others. What if I miss a dose? It is important not to miss your dose. Call your doctor or health care professional if you are unable to keep an appointment. What may interact with this medicine? This medicine may interact with the following medications: -other iron products This list may not describe all possible interactions. Give your health care provider a list of all the medicines, herbs, non-prescription drugs, or dietary supplements you use. Also tell them if you smoke, drink alcohol, or use illegal drugs. Some items may interact with your medicine. What should I watch for while using this medicine? Visit your doctor or healthcare professional regularly. Tell your doctor or healthcare professional if your symptoms do not start to get  better or if they get worse. You may need blood work done while you are taking this medicine. You may need to follow a special diet. Talk to your doctor. Foods that contain iron include: whole grains/cereals, dried fruits, beans, or peas, leafy green vegetables, and organ meats (liver, kidney). What side effects may I notice from receiving this medicine? Side effects that you should report to your doctor or health care professional as soon as possible: -allergic reactions like skin rash, itching or hives, swelling of the face, lips, or tongue -breathing problems -changes in blood pressure -feeling faint or lightheaded, falls -fever or chills -flushing, sweating, or hot feelings -swelling of the ankles or feet Side effects that usually do not require medical attention (Report these to your doctor or health care professional if they continue or are bothersome.): -diarrhea -headache -nausea, vomiting -stomach pain This list may not describe all possible side effects. Call your doctor for medical advice about side effects. You may report side effects to FDA at 1-800-FDA-1088. Where should I keep my medicine? This drug is given in a hospital or clinic and will not be stored at home. NOTE: This sheet is a summary. It may not cover all possible information. If you have questions about this medicine, talk  to your doctor, pharmacist, or health care provider.  2014, [employer]vier/Gold Standard. (2012-05-01 15:23:36)

## 2013-11-18 ENCOUNTER — Telehealth: Payer: Self-pay | Admitting: Hematology & Oncology

## 2013-11-18 NOTE — Telephone Encounter (Signed)
Patient called and cx 11/22/13 apt and stated she will call back to resch

## 2013-11-22 ENCOUNTER — Ambulatory Visit: Payer: Managed Care, Other (non HMO) | Admitting: Hematology & Oncology

## 2013-11-22 ENCOUNTER — Other Ambulatory Visit: Payer: Managed Care, Other (non HMO) | Admitting: Lab

## 2015-07-12 DIAGNOSIS — Z6821 Body mass index (BMI) 21.0-21.9, adult: Secondary | ICD-10-CM | POA: Diagnosis not present

## 2015-07-12 DIAGNOSIS — Z1231 Encounter for screening mammogram for malignant neoplasm of breast: Secondary | ICD-10-CM | POA: Diagnosis not present

## 2015-07-12 DIAGNOSIS — Z01419 Encounter for gynecological examination (general) (routine) without abnormal findings: Secondary | ICD-10-CM | POA: Diagnosis not present

## 2015-08-29 DIAGNOSIS — Z23 Encounter for immunization: Secondary | ICD-10-CM | POA: Diagnosis not present

## 2015-11-11 DIAGNOSIS — M069 Rheumatoid arthritis, unspecified: Secondary | ICD-10-CM | POA: Insufficient documentation

## 2015-11-11 DIAGNOSIS — R7689 Other specified abnormal immunological findings in serum: Secondary | ICD-10-CM | POA: Insufficient documentation

## 2015-11-11 DIAGNOSIS — R768 Other specified abnormal immunological findings in serum: Secondary | ICD-10-CM | POA: Insufficient documentation

## 2015-11-13 DIAGNOSIS — M0609 Rheumatoid arthritis without rheumatoid factor, multiple sites: Secondary | ICD-10-CM | POA: Diagnosis not present

## 2015-11-13 DIAGNOSIS — R768 Other specified abnormal immunological findings in serum: Secondary | ICD-10-CM | POA: Diagnosis not present

## 2015-11-13 DIAGNOSIS — Z79899 Other long term (current) drug therapy: Secondary | ICD-10-CM | POA: Diagnosis not present

## 2015-11-13 DIAGNOSIS — R252 Cramp and spasm: Secondary | ICD-10-CM | POA: Diagnosis not present

## 2016-07-31 DIAGNOSIS — Z23 Encounter for immunization: Secondary | ICD-10-CM | POA: Diagnosis not present

## 2016-08-04 DIAGNOSIS — J019 Acute sinusitis, unspecified: Secondary | ICD-10-CM | POA: Diagnosis not present

## 2016-10-02 DIAGNOSIS — J209 Acute bronchitis, unspecified: Secondary | ICD-10-CM | POA: Diagnosis not present

## 2016-10-02 DIAGNOSIS — R05 Cough: Secondary | ICD-10-CM | POA: Diagnosis not present

## 2016-10-02 DIAGNOSIS — J984 Other disorders of lung: Secondary | ICD-10-CM | POA: Diagnosis not present

## 2016-10-28 DIAGNOSIS — R0981 Nasal congestion: Secondary | ICD-10-CM | POA: Diagnosis not present

## 2016-10-28 DIAGNOSIS — R030 Elevated blood-pressure reading, without diagnosis of hypertension: Secondary | ICD-10-CM | POA: Diagnosis not present

## 2016-10-28 DIAGNOSIS — R350 Frequency of micturition: Secondary | ICD-10-CM | POA: Diagnosis not present

## 2016-10-28 DIAGNOSIS — R05 Cough: Secondary | ICD-10-CM | POA: Diagnosis not present

## 2016-11-19 DIAGNOSIS — M0609 Rheumatoid arthritis without rheumatoid factor, multiple sites: Secondary | ICD-10-CM | POA: Diagnosis not present

## 2016-11-19 DIAGNOSIS — Z79899 Other long term (current) drug therapy: Secondary | ICD-10-CM | POA: Diagnosis not present

## 2016-11-25 DIAGNOSIS — Z1231 Encounter for screening mammogram for malignant neoplasm of breast: Secondary | ICD-10-CM | POA: Diagnosis not present

## 2017-02-03 DIAGNOSIS — Z136 Encounter for screening for cardiovascular disorders: Secondary | ICD-10-CM | POA: Diagnosis not present

## 2017-02-03 DIAGNOSIS — M419 Scoliosis, unspecified: Secondary | ICD-10-CM | POA: Diagnosis not present

## 2017-02-03 DIAGNOSIS — Z0000 Encounter for general adult medical examination without abnormal findings: Secondary | ICD-10-CM | POA: Diagnosis not present

## 2017-02-03 DIAGNOSIS — Z13220 Encounter for screening for lipoid disorders: Secondary | ICD-10-CM | POA: Diagnosis not present

## 2017-03-25 DIAGNOSIS — H2513 Age-related nuclear cataract, bilateral: Secondary | ICD-10-CM | POA: Diagnosis not present

## 2017-03-25 DIAGNOSIS — H35373 Puckering of macula, bilateral: Secondary | ICD-10-CM | POA: Diagnosis not present

## 2017-07-18 DIAGNOSIS — Z23 Encounter for immunization: Secondary | ICD-10-CM | POA: Diagnosis not present

## 2017-09-11 DIAGNOSIS — Z6821 Body mass index (BMI) 21.0-21.9, adult: Secondary | ICD-10-CM | POA: Diagnosis not present

## 2017-09-11 DIAGNOSIS — R350 Frequency of micturition: Secondary | ICD-10-CM | POA: Diagnosis not present

## 2017-09-11 DIAGNOSIS — L292 Pruritus vulvae: Secondary | ICD-10-CM | POA: Diagnosis not present

## 2017-09-11 DIAGNOSIS — Z124 Encounter for screening for malignant neoplasm of cervix: Secondary | ICD-10-CM | POA: Diagnosis not present

## 2017-09-17 DIAGNOSIS — F4321 Adjustment disorder with depressed mood: Secondary | ICD-10-CM | POA: Diagnosis not present

## 2017-09-17 DIAGNOSIS — M25559 Pain in unspecified hip: Secondary | ICD-10-CM | POA: Diagnosis not present

## 2017-09-17 DIAGNOSIS — M79673 Pain in unspecified foot: Secondary | ICD-10-CM | POA: Diagnosis not present

## 2017-10-08 ENCOUNTER — Encounter (INDEPENDENT_AMBULATORY_CARE_PROVIDER_SITE_OTHER): Payer: Self-pay | Admitting: Orthopaedic Surgery

## 2017-10-08 ENCOUNTER — Ambulatory Visit (INDEPENDENT_AMBULATORY_CARE_PROVIDER_SITE_OTHER): Payer: Medicare Other

## 2017-10-08 ENCOUNTER — Other Ambulatory Visit (INDEPENDENT_AMBULATORY_CARE_PROVIDER_SITE_OTHER): Payer: Self-pay

## 2017-10-08 ENCOUNTER — Ambulatory Visit (INDEPENDENT_AMBULATORY_CARE_PROVIDER_SITE_OTHER): Payer: Medicare Other | Admitting: Orthopaedic Surgery

## 2017-10-08 DIAGNOSIS — M25551 Pain in right hip: Secondary | ICD-10-CM

## 2017-10-08 NOTE — Progress Notes (Signed)
-  0 .  +[id number]                                                                                                                                                                                                                                                                                                                                             66 

## 2017-10-08 NOTE — Progress Notes (Signed)
Patient: Danielle Villegas           Date of Birth: [birth year]           MRN: [id number] Visit Date: 10/08/2017              Requested by: Lawerance Cruel, [place],  [id number] PCP: Lawerance Cruel, MD   Assessment & Plan: Visit Diagnoses:  1. Pain in right hip     Plan:.  Right now I feel the best plan would be to set her up for an intra-articular injection in her right hip joint with a steroid by Dr. Ernestina Patches or [facility] under direct fluoroscopy.  She would like to have this as soon as possible since she leaves for a trip next Wednesday.  We had a long and thorough discussion about her hip 1 of her x-rays in general.  If this helped that would be great if not most likely will end up with an MRI of her right hip to further assess the cartilage.  All questions concerns were answered and addressed.  I will see her back myself in 4 weeks and we can see how this injection is done for her.  Follow-Up Instructions: Return in about 4 weeks (around 11/05/2017).   Orders:  Orders Placed This Encounter  Procedures  . XR HIP UNILAT W OR W/O PELVIS 1V RIGHT   No orders of the defined types were placed in this encounter.     Procedures: No procedures performed   Clinical Data: No additional findings.   Subjective: No chief complaint on file. Patient is a very pleasant thin 62 year old female with right hip pain is been worsening for the last several months now.  She is put it off for a long period of time and dealing with the pain after her husband died suddenly last year.  She is very tearful describing that process and pain to me.  She does hurt in the groin he said her hip is become very stiff.  She is on chronic steroids due to rheumatoid disease.  She does take 2 baby aspirin daily as well as meloxicam in the morning.  She does have a [place] filter due to having a blood clot in the past.  She denies any injury to her hip.  Is gotten the point  though that is detrimentally affecting her active daily living, quality of life, mobility.  The pain can be daily.  HPI  Review of Systems She currently denies any headache, chest pain, shortness of breath, fever, chills, nausea, vomiting.  Objective: Vital Signs: There were no vitals taken for this visit.  Physical Exam She is alert and oriented x3 and in no acute distress Ortho Exam Examination of both hips shows pain in the groin with rotation of both hips.  There is significant stiffness with rotation of both hips. Specialty Comments:  No specialty comments available.  Imaging: Xr Hip Unilat W Or W/o Pelvis 1v Right  Result Date: 10/08/2017 An AP pelvis and lateral of the right hip shows questionable changes of avascular necrosis in the femoral head is very subtle.  There are periarticular osteophytes around the femoral head and neck area.    PMFS History: Patient Active Problem List   Diagnosis Date Noted  . Routine general medical examination at a health care facility 06/18/2013  . Post-op stiffness of left TKA 01/25/2013  . Autoimmune disease, not elsewhere classified(279.49) 01/10/2013  . Reactive depression (situational) 01/10/2013  .  Elevated BP 01/10/2013  . Thrombocytopenia ([place]) 09/13/2012  . Anemia 09/13/2012  . Lower leg DVT (deep venous thromboembolism), acute ([place]) 09/12/2012  . Patellar dislocation 09/11/2012  . Fever 09/11/2012  . Memory loss 04/13/2012  . Generalized headaches 04/13/2012  . Otalgia 04/13/2012   Past Medical History:  Diagnosis Date  . Anemia    hx of   . Anxiety   . Basal cell carcinoma   . DVT (deep venous thrombosis) ([place])    08/2012 in left leg   . Environmental allergies   . Factor 5 Leiden mutation, heterozygous ([place])   . Fibromyalgia   . Lupus   . Pneumonia    hx of 22 years ago   . RA (rheumatoid arthritis) ([place])   . Seizure ([place])    1997  . Stroke ([place])    1997  . UTI (urinary tract infection)    hx of at age 80      Family History  Problem Relation Age of Onset  . Alcohol abuse Unknown   . Arthritis Unknown   . Breast cancer Unknown   . Prostate cancer Father   . Stroke Unknown   . Hypertension Unknown   . Heart disease Unknown     Past Surgical History:  Procedure Laterality Date  . ivc filter     . KNEE ARTHROSCOPY     left  and to repair   . KNEE CLOSED REDUCTION Left 01/25/2013   Procedure: CLOSED MANIPULATION KNEE;  Surgeon: Mauri Pole, MD;  Location: [facility];  Service: Orthopedics;  Laterality: Left;  . TONSILLECTOMY AND ADENOIDECTOMY     Social History   Occupational History  . Not on file  Tobacco Use  . Smoking status: Never Smoker  . Smokeless tobacco: Never Used  Substance and Sexual Activity  . Alcohol use: Yes    Comment: Rarely  . Drug use: No  . Sexual activity: Not on file

## 2017-10-09 ENCOUNTER — Other Ambulatory Visit (INDEPENDENT_AMBULATORY_CARE_PROVIDER_SITE_OTHER): Payer: Self-pay

## 2017-10-09 ENCOUNTER — Telehealth (INDEPENDENT_AMBULATORY_CARE_PROVIDER_SITE_OTHER): Payer: Self-pay | Admitting: Orthopaedic Surgery

## 2017-10-09 DIAGNOSIS — M25551 Pain in right hip: Secondary | ICD-10-CM

## 2017-10-09 NOTE — Telephone Encounter (Signed)
Sent to [facility]

## 2017-10-09 NOTE — Telephone Encounter (Signed)
Patient called to let you know that she was not able to schedule the appointment with Dr. Ernestina Patches because she will be going out of town before they can fit her in.  She is needing the injection by 1/14.  She wanted to know if she could get this done at [facility] before then.  Please advise patient [phone].  Thank you.

## 2017-10-10 ENCOUNTER — Telehealth (INDEPENDENT_AMBULATORY_CARE_PROVIDER_SITE_OTHER): Payer: Self-pay | Admitting: Orthopaedic Surgery

## 2017-10-10 NOTE — Telephone Encounter (Signed)
Can we put her on a cancellation list for this hip injection?  She's going [place] next [place] and is BEGGING for an appt [facility] doesn't have anything until end of next week   Otherwise, she would like to go ahead and get scheduled for when she returns

## 2017-10-10 NOTE — Telephone Encounter (Signed)
Patient called in a lot of pain, she's waiting to get an injection in her hip and is needing it either today or Monday if possible. She leaves to go out of state next week and states she HAS to get it done before she leaves. CB # [phone]

## 2017-10-10 NOTE — Telephone Encounter (Signed)
Worked in for Tuesday 1/15 at [facility]

## 2017-10-14 ENCOUNTER — Ambulatory Visit (INDEPENDENT_AMBULATORY_CARE_PROVIDER_SITE_OTHER): Payer: Medicare Other

## 2017-10-14 ENCOUNTER — Encounter (INDEPENDENT_AMBULATORY_CARE_PROVIDER_SITE_OTHER): Payer: Self-pay | Admitting: Physical Medicine and Rehabilitation

## 2017-10-14 ENCOUNTER — Ambulatory Visit (INDEPENDENT_AMBULATORY_CARE_PROVIDER_SITE_OTHER): Payer: Medicare Other | Admitting: Physical Medicine and Rehabilitation

## 2017-10-14 DIAGNOSIS — M25551 Pain in right hip: Secondary | ICD-10-CM | POA: Diagnosis not present

## 2017-10-14 MED ORDER — TRIAMCINOLONE ACETONIDE 40 MG/ML IJ SUSP
80.0000 mg | INTRAMUSCULAR | Status: AC | PRN
  Administered 2017-10-14: 80 mg via INTRA_ARTICULAR

## 2017-10-14 MED ORDER — BUPIVACAINE HCL 0.5 % IJ SOLN
3.0000 mL | INTRAMUSCULAR | Status: AC | PRN
  Administered 2017-10-14: 3 mL via INTRA_ARTICULAR

## 2017-10-14 NOTE — Patient Instructions (Signed)

## 2017-10-14 NOTE — Progress Notes (Signed)
Danielle Villegas - 62 y.o. female MRN [id number]  Date of birth: 1956-07-02  Office Visit Note: Visit Date: 10/14/2017 PCP: Lawerance Cruel, MD Referred by: Lawerance Cruel, MD  Subjective: Chief Complaint  Patient presents with  . Right Hip - Pain   HPI: Danielle Villegas is a 62 year old female right hip and groin pain.  She was recently evaluated by Dr. Ninfa Linden who suggested intra-articular hip injection.  We are going to complete an anesthetic hip arthrogram on the right.    ROS Otherwise per HPI.  Assessment & Plan: Visit Diagnoses:  1. Pain in right hip     Plan: Findings:  Diagnostic and therapeutic anesthetic hip arthrogram on the right.    Meds & Orders: No orders of the defined types were placed in this encounter.   Orders Placed This Encounter  Procedures  . Large Joint Inj: R hip joint  . XR C-ARM NO REPORT    Follow-up: Return if symptoms worsen or fail to improve, for Dr. Ninfa Linden.   Procedures: Large Joint Inj: R hip joint on 10/14/2017 8:21 AM Indications: pain and diagnostic evaluation Details: 22 G needle, anterior approach  Arthrogram: Yes  Medications: 80 mg triamcinolone acetonide 40 MG/ML; 3 mL bupivacaine 0.5 % Outcome: tolerated well, no immediate complications  Arthrogram demonstrated excellent flow of contrast throughout the joint surface without extravasation or obvious defect.  The patient had relief of symptoms during the anesthetic phase of the injection.  Procedure, treatment alternatives, risks and benefits explained, specific risks discussed. Consent was given by the patient. Immediately prior to procedure a time out was called to verify the correct patient, procedure, equipment, support staff and site/side marked as required. Patient was prepped and draped in the usual sterile fashion.      No notes on file   Clinical History: No specialty comments available.  She reports that  has never smoked. she has never used  smokeless tobacco. No results for input(s): HGBA1C, LABURIC in the last 8760 hours.  Objective:  VS:  HT:    WT:   BMI:     BP:   HR: bpm  TEMP: ( )  RESP:  Physical Exam  Ortho Exam Imaging: No results found.  Past Medical/Family/Surgical/Social History: Medications & Allergies reviewed per EMR Patient Active Problem List   Diagnosis Date Noted  . Routine general medical examination at a health care facility 06/18/2013  . Post-op stiffness of left TKA 01/25/2013  . Autoimmune disease, not elsewhere classified(279.49) 01/10/2013  . Reactive depression (situational) 01/10/2013  . Elevated BP 01/10/2013  . Thrombocytopenia ([facility]) 09/13/2012  . Anemia 09/13/2012  . Lower leg DVT (deep venous thromboembolism), acute ([place]) 09/12/2012  . Patellar dislocation 09/11/2012  . Fever 09/11/2012  . Memory loss 04/13/2012  . Generalized headaches 04/13/2012  . Otalgia 04/13/2012   Past Medical History:  Diagnosis Date  . Anemia    hx of   . Anxiety   . Basal cell carcinoma   . DVT (deep venous thrombosis) ([facility])    08/2012 in left leg   . Environmental allergies   . Factor 5 Leiden mutation, heterozygous ([place] [facility])   . Fibromyalgia   . Lupus   . Pneumonia    hx of 22 years ago   . RA (rheumatoid arthritis) ([facility])   . Seizure ([place])    1997  . Stroke ([place])    1997  . UTI (urinary tract infection)    hx of at age 47  Family History  Problem Relation Age of Onset  . Alcohol abuse Unknown   . Arthritis Unknown   . Breast cancer Unknown   . Prostate cancer Father   . Stroke Unknown   . Hypertension Unknown   . Heart disease Unknown    Past Surgical History:  Procedure Laterality Date  . ivc filter     . KNEE ARTHROSCOPY     left  and to repair   . KNEE CLOSED REDUCTION Left 01/25/2013   Procedure: CLOSED MANIPULATION KNEE;  Surgeon: Mauri Pole, MD;  Location: [facility];  Service: Orthopedics;  Laterality: Left;  . TONSILLECTOMY AND ADENOIDECTOMY     Social History    Occupational History  . Not on file  Tobacco Use  . Smoking status: Never Smoker  . Smokeless tobacco: Never Used  Substance and Sexual Activity  . Alcohol use: Yes    Comment: Rarely  . Drug use: No  . Sexual activity: Not on file

## 2017-10-14 NOTE — Progress Notes (Deleted)
Right hip pain, takes 2 baby ASA daily for factor 5, - Dye Allergy

## 2017-11-05 ENCOUNTER — Encounter (INDEPENDENT_AMBULATORY_CARE_PROVIDER_SITE_OTHER): Payer: Self-pay | Admitting: Orthopaedic Surgery

## 2017-11-05 ENCOUNTER — Telehealth (INDEPENDENT_AMBULATORY_CARE_PROVIDER_SITE_OTHER): Payer: Self-pay | Admitting: Orthopaedic Surgery

## 2017-11-05 ENCOUNTER — Ambulatory Visit (INDEPENDENT_AMBULATORY_CARE_PROVIDER_SITE_OTHER): Payer: Medicare Other | Admitting: Orthopaedic Surgery

## 2017-11-05 DIAGNOSIS — M25551 Pain in right hip: Secondary | ICD-10-CM | POA: Diagnosis not present

## 2017-11-05 NOTE — Progress Notes (Signed)
Danielle Villegas is a 62 year old female returns today status post right hip intra-articular injection by Dr. Ernestina Patches on 10/14/2017.  States she is not 100% better but is much better.  She is able to get up and down her steps easier.  She does not have to hold onto things to ambulate.  She notes a twinge of pain with certain movements of the hip.  She states that she is probably 90% improved.  Physical exam: Right hip she has limited external rotation of the right hip with slight discomfort.  Left hip full range of motion without pain.  She ambulates without any assistive device.  Impression: Right hip avascular necrosis  Plan: At this point time we will see her back as needed.  Discussed with her that would not recommend intra-articular injections of the hip in a more frequent than every 6 months.  Questions encouraged and answered at length by Dr. Ninfa Linden myself.  Due to the subtle changes of AVN on her hip may order an MRI if her pain returns.  However if it has been more than 6 months most likely recommend a intra-articular injection.

## 2017-11-05 NOTE — Telephone Encounter (Signed)
Patient called wanting to know if it was okay to go to the gym?  CB#[phone].  Thank you.

## 2017-11-06 NOTE — Telephone Encounter (Signed)
Please advise 

## 2017-11-06 NOTE — Telephone Encounter (Signed)
It is ok for her to go to he gym.

## 2017-11-06 NOTE — Telephone Encounter (Signed)
Patient aware of the below message  

## 2017-11-21 DIAGNOSIS — M0609 Rheumatoid arthritis without rheumatoid factor, multiple sites: Secondary | ICD-10-CM | POA: Diagnosis not present

## 2017-11-21 DIAGNOSIS — Z79899 Other long term (current) drug therapy: Secondary | ICD-10-CM | POA: Diagnosis not present

## 2018-02-13 DIAGNOSIS — B372 Candidiasis of skin and nail: Secondary | ICD-10-CM | POA: Diagnosis not present

## 2018-02-16 DIAGNOSIS — Z95828 Presence of other vascular implants and grafts: Secondary | ICD-10-CM | POA: Diagnosis not present

## 2018-02-16 DIAGNOSIS — E7800 Pure hypercholesterolemia, unspecified: Secondary | ICD-10-CM | POA: Diagnosis not present

## 2018-02-16 DIAGNOSIS — D6851 Activated protein C resistance: Secondary | ICD-10-CM | POA: Diagnosis not present

## 2018-02-16 DIAGNOSIS — R030 Elevated blood-pressure reading, without diagnosis of hypertension: Secondary | ICD-10-CM | POA: Diagnosis not present

## 2018-02-16 DIAGNOSIS — Z131 Encounter for screening for diabetes mellitus: Secondary | ICD-10-CM | POA: Diagnosis not present

## 2018-02-16 DIAGNOSIS — Z0000 Encounter for general adult medical examination without abnormal findings: Secondary | ICD-10-CM | POA: Diagnosis not present

## 2018-02-16 DIAGNOSIS — F4321 Adjustment disorder with depressed mood: Secondary | ICD-10-CM | POA: Diagnosis not present

## 2018-05-05 DIAGNOSIS — Z1231 Encounter for screening mammogram for malignant neoplasm of breast: Secondary | ICD-10-CM | POA: Diagnosis not present

## 2018-07-10 ENCOUNTER — Telehealth (INDEPENDENT_AMBULATORY_CARE_PROVIDER_SITE_OTHER): Payer: Self-pay | Admitting: Physical Medicine and Rehabilitation

## 2018-07-10 NOTE — Telephone Encounter (Signed)
Scheduled for 07/24/18 at 1415.

## 2018-07-10 NOTE — Telephone Encounter (Signed)
yes

## 2018-07-16 ENCOUNTER — Telehealth (INDEPENDENT_AMBULATORY_CARE_PROVIDER_SITE_OTHER): Payer: Self-pay | Admitting: Orthopaedic Surgery

## 2018-07-16 NOTE — Telephone Encounter (Signed)
Patient called requesting a handicap sticker.  Please call patient to advise.

## 2018-07-16 NOTE — Telephone Encounter (Signed)
ok 

## 2018-07-16 NOTE — Telephone Encounter (Signed)
Ok

## 2018-07-17 NOTE — Telephone Encounter (Signed)
Patient aware this is at front desk for her

## 2018-07-24 ENCOUNTER — Encounter (INDEPENDENT_AMBULATORY_CARE_PROVIDER_SITE_OTHER): Payer: Self-pay | Admitting: Physical Medicine and Rehabilitation

## 2018-07-24 ENCOUNTER — Ambulatory Visit (INDEPENDENT_AMBULATORY_CARE_PROVIDER_SITE_OTHER): Payer: Self-pay

## 2018-07-24 ENCOUNTER — Ambulatory Visit (INDEPENDENT_AMBULATORY_CARE_PROVIDER_SITE_OTHER): Payer: Medicare Other | Admitting: Physical Medicine and Rehabilitation

## 2018-07-24 DIAGNOSIS — M25551 Pain in right hip: Secondary | ICD-10-CM

## 2018-07-24 NOTE — Progress Notes (Signed)
 .  Numeric Pain Rating Scale and Functional Assessment Average Pain 8   In the last MONTH (on 0-10 scale) has pain interfered with the following?  1. General activity like being  able to carry out your everyday physical activities such as walking, climbing stairs, carrying groceries, or moving a chair?  Rating(4)   -Dye Allergies.  

## 2018-07-24 NOTE — Progress Notes (Signed)
   Danielle Villegas - 62 y.o. female MRN [id number]  Date of birth: [birth year]  Office Visit Note: Visit Date: 07/24/2018 PCP: Lawerance Cruel, MD Referred by: Lawerance Cruel, MD  Subjective: Chief Complaint  Patient presents with  . Right Hip - Pain   HPI:  Danielle Villegas is a 62 y.o. female who comes in today At the request of Dr. Jean Rosenthal for repeat intra-articular hip injection with fluoroscopic guidance.  Last injection performed in January gave her 90% relief for about 7 months.  She fell down and he suggested repeating the injection which I think is fair.  She is had no new trauma no pain down the leg or radicular pain no paresthesias.  Pain in the right groin worse with movement and changing positions.  We will repeat the injection today diagnostically and therapeutically.  ROS Otherwise per HPI.  Assessment & Plan: Visit Diagnoses:  1. Pain in right hip     Plan: No additional findings.   Meds & Orders: No orders of the defined types were placed in this encounter.   Orders Placed This Encounter  Procedures  . Large Joint Inj: R hip joint  . XR C-ARM NO REPORT    Follow-up: Return if symptoms worsen or fail to improve.   Procedures: Large Joint Inj: R hip joint on 07/24/2018 2:35 PM Indications: diagnostic evaluation and pain Details: 22 G 3.5 in needle, fluoroscopy-guided anterior approach  Arthrogram: No  Medications: 80 mg triamcinolone acetonide 40 MG/ML; 3 mL bupivacaine 0.5 % Outcome: tolerated well, no immediate complications  There was excellent flow of contrast producing a partial arthrogram of the hip. The patient did have relief of symptoms during the anesthetic phase of the injection. Procedure, treatment alternatives, risks and benefits explained, specific risks discussed. Consent was given by the patient. Immediately prior to procedure a time out was called to verify the correct patient, procedure, equipment, support staff and  site/side marked as required. Patient was prepped and draped in the usual sterile fashion.      No notes on file   Clinical History: No specialty comments available.     Objective:  VS:  HT:    WT:   BMI:     BP:   HR: bpm  TEMP: ( )  RESP:  Physical Exam  Ortho Exam Imaging: No results found.

## 2018-07-31 MED ORDER — BUPIVACAINE HCL 0.5 % IJ SOLN
3.0000 mL | INTRAMUSCULAR | Status: AC | PRN
  Administered 2018-07-24: 3 mL via INTRA_ARTICULAR

## 2018-07-31 MED ORDER — TRIAMCINOLONE ACETONIDE 40 MG/ML IJ SUSP
80.0000 mg | INTRAMUSCULAR | Status: AC | PRN
  Administered 2018-07-24: 80 mg via INTRA_ARTICULAR

## 2018-08-03 DIAGNOSIS — Z23 Encounter for immunization: Secondary | ICD-10-CM | POA: Diagnosis not present

## 2018-10-02 DIAGNOSIS — J019 Acute sinusitis, unspecified: Secondary | ICD-10-CM | POA: Diagnosis not present

## 2018-10-02 DIAGNOSIS — R05 Cough: Secondary | ICD-10-CM | POA: Diagnosis not present

## 2018-10-09 DIAGNOSIS — H6690 Otitis media, unspecified, unspecified ear: Secondary | ICD-10-CM | POA: Diagnosis not present

## 2018-10-09 DIAGNOSIS — R05 Cough: Secondary | ICD-10-CM | POA: Diagnosis not present

## 2018-10-20 DIAGNOSIS — H6592 Unspecified nonsuppurative otitis media, left ear: Secondary | ICD-10-CM | POA: Diagnosis not present

## 2018-11-23 DIAGNOSIS — H9012 Conductive hearing loss, unilateral, left ear, with unrestricted hearing on the contralateral side: Secondary | ICD-10-CM | POA: Diagnosis not present

## 2018-11-23 DIAGNOSIS — H6592 Unspecified nonsuppurative otitis media, left ear: Secondary | ICD-10-CM | POA: Diagnosis not present

## 2018-11-23 DIAGNOSIS — H9312 Tinnitus, left ear: Secondary | ICD-10-CM | POA: Diagnosis not present

## 2018-11-23 DIAGNOSIS — H9192 Unspecified hearing loss, left ear: Secondary | ICD-10-CM | POA: Diagnosis not present

## 2018-11-24 DIAGNOSIS — M0609 Rheumatoid arthritis without rheumatoid factor, multiple sites: Secondary | ICD-10-CM | POA: Diagnosis not present

## 2018-11-24 DIAGNOSIS — Z79899 Other long term (current) drug therapy: Secondary | ICD-10-CM | POA: Diagnosis not present

## 2019-02-08 DIAGNOSIS — Z6821 Body mass index (BMI) 21.0-21.9, adult: Secondary | ICD-10-CM | POA: Diagnosis not present

## 2019-02-08 DIAGNOSIS — C4490 Unspecified malignant neoplasm of skin, unspecified: Secondary | ICD-10-CM | POA: Insufficient documentation

## 2019-02-08 DIAGNOSIS — D6851 Activated protein C resistance: Secondary | ICD-10-CM | POA: Insufficient documentation

## 2019-02-08 DIAGNOSIS — Z01419 Encounter for gynecological examination (general) (routine) without abnormal findings: Secondary | ICD-10-CM | POA: Diagnosis not present

## 2019-03-02 DIAGNOSIS — H25013 Cortical age-related cataract, bilateral: Secondary | ICD-10-CM | POA: Diagnosis not present

## 2019-03-02 DIAGNOSIS — E7800 Pure hypercholesterolemia, unspecified: Secondary | ICD-10-CM | POA: Diagnosis not present

## 2019-03-02 DIAGNOSIS — H353131 Nonexudative age-related macular degeneration, bilateral, early dry stage: Secondary | ICD-10-CM | POA: Diagnosis not present

## 2019-03-02 DIAGNOSIS — H2510 Age-related nuclear cataract, unspecified eye: Secondary | ICD-10-CM | POA: Diagnosis not present

## 2019-03-02 DIAGNOSIS — Z0000 Encounter for general adult medical examination without abnormal findings: Secondary | ICD-10-CM | POA: Diagnosis not present

## 2019-03-05 DIAGNOSIS — Z1159 Encounter for screening for other viral diseases: Secondary | ICD-10-CM | POA: Diagnosis not present

## 2019-03-05 DIAGNOSIS — Z131 Encounter for screening for diabetes mellitus: Secondary | ICD-10-CM | POA: Diagnosis not present

## 2019-03-05 DIAGNOSIS — I10 Essential (primary) hypertension: Secondary | ICD-10-CM | POA: Diagnosis not present

## 2019-03-05 DIAGNOSIS — K219 Gastro-esophageal reflux disease without esophagitis: Secondary | ICD-10-CM | POA: Diagnosis not present

## 2019-03-05 DIAGNOSIS — F430 Acute stress reaction: Secondary | ICD-10-CM | POA: Diagnosis not present

## 2019-03-05 DIAGNOSIS — H538 Other visual disturbances: Secondary | ICD-10-CM | POA: Diagnosis not present

## 2019-03-05 DIAGNOSIS — M169 Osteoarthritis of hip, unspecified: Secondary | ICD-10-CM | POA: Diagnosis not present

## 2019-03-05 DIAGNOSIS — E7800 Pure hypercholesterolemia, unspecified: Secondary | ICD-10-CM | POA: Diagnosis not present

## 2019-03-17 DIAGNOSIS — H353131 Nonexudative age-related macular degeneration, bilateral, early dry stage: Secondary | ICD-10-CM | POA: Diagnosis not present

## 2019-03-17 DIAGNOSIS — H2510 Age-related nuclear cataract, unspecified eye: Secondary | ICD-10-CM | POA: Diagnosis not present

## 2019-03-17 DIAGNOSIS — H35373 Puckering of macula, bilateral: Secondary | ICD-10-CM | POA: Diagnosis not present

## 2019-03-25 DIAGNOSIS — I10 Essential (primary) hypertension: Secondary | ICD-10-CM | POA: Diagnosis not present

## 2019-04-16 DIAGNOSIS — H35111 Retinopathy of prematurity, stage 0, right eye: Secondary | ICD-10-CM | POA: Diagnosis not present

## 2019-04-16 DIAGNOSIS — H2511 Age-related nuclear cataract, right eye: Secondary | ICD-10-CM | POA: Diagnosis not present

## 2019-04-16 DIAGNOSIS — Z01818 Encounter for other preprocedural examination: Secondary | ICD-10-CM | POA: Diagnosis not present

## 2019-05-24 DIAGNOSIS — H25811 Combined forms of age-related cataract, right eye: Secondary | ICD-10-CM | POA: Diagnosis not present

## 2019-05-24 DIAGNOSIS — H25011 Cortical age-related cataract, right eye: Secondary | ICD-10-CM | POA: Diagnosis not present

## 2019-05-24 DIAGNOSIS — H2511 Age-related nuclear cataract, right eye: Secondary | ICD-10-CM | POA: Diagnosis not present

## 2019-05-31 DIAGNOSIS — H2511 Age-related nuclear cataract, right eye: Secondary | ICD-10-CM | POA: Diagnosis not present

## 2019-05-31 DIAGNOSIS — H25011 Cortical age-related cataract, right eye: Secondary | ICD-10-CM | POA: Diagnosis not present

## 2019-06-09 DIAGNOSIS — H25812 Combined forms of age-related cataract, left eye: Secondary | ICD-10-CM | POA: Diagnosis not present

## 2019-06-09 DIAGNOSIS — H2512 Age-related nuclear cataract, left eye: Secondary | ICD-10-CM | POA: Diagnosis not present

## 2019-06-22 DIAGNOSIS — Z85828 Personal history of other malignant neoplasm of skin: Secondary | ICD-10-CM | POA: Diagnosis not present

## 2019-06-22 DIAGNOSIS — L821 Other seborrheic keratosis: Secondary | ICD-10-CM | POA: Diagnosis not present

## 2019-07-16 DIAGNOSIS — Z23 Encounter for immunization: Secondary | ICD-10-CM | POA: Diagnosis not present

## 2019-07-19 ENCOUNTER — Telehealth: Payer: Self-pay | Admitting: Physical Medicine and Rehabilitation

## 2019-07-19 NOTE — Telephone Encounter (Signed)
Left message #1

## 2019-07-19 NOTE — Telephone Encounter (Signed)
Yes ok 

## 2019-07-19 NOTE — Telephone Encounter (Signed)
Scheduled for 11/9 at 1430.

## 2019-08-06 DIAGNOSIS — S63502A Unspecified sprain of left wrist, initial encounter: Secondary | ICD-10-CM | POA: Diagnosis not present

## 2019-08-06 DIAGNOSIS — M25532 Pain in left wrist: Secondary | ICD-10-CM | POA: Diagnosis not present

## 2019-08-09 ENCOUNTER — Ambulatory Visit: Payer: Self-pay

## 2019-08-09 ENCOUNTER — Encounter: Payer: Self-pay | Admitting: Physical Medicine and Rehabilitation

## 2019-08-09 ENCOUNTER — Ambulatory Visit (INDEPENDENT_AMBULATORY_CARE_PROVIDER_SITE_OTHER): Payer: Medicare Other | Admitting: Physical Medicine and Rehabilitation

## 2019-08-09 ENCOUNTER — Other Ambulatory Visit: Payer: Self-pay

## 2019-08-09 DIAGNOSIS — M25551 Pain in right hip: Secondary | ICD-10-CM | POA: Diagnosis not present

## 2019-08-09 NOTE — Progress Notes (Signed)
 .  Numeric Pain Rating Scale and Functional Assessment Average Pain 7   In the last MONTH (on 0-10 scale) has pain interfered with the following?  1. General activity like being  able to carry out your everyday physical activities such as walking, climbing stairs, carrying groceries, or moving a chair?  Rating(7)  -Dye Allergies.  

## 2019-08-09 NOTE — Progress Notes (Signed)
Danielle CLENDENNEN - 63 y.o. female MRN [id number]  Date of birth: [birth year]  Office Visit Note: Visit Date: 08/09/2019 PCP: Lawerance Cruel, MD Referred by: Lawerance Cruel, MD  Subjective: Chief Complaint  Patient presents with  . Right Hip - Pain   HPI:  Danielle Villegas is a 63 y.o. female who comes in today For planned right anesthetic hip arthrogram.  I saw the patient in 2019 and October of that year and completed hip injection which was beneficial for a while.  She is having right hip and groin pain worse with sitting and walking.  She rates her pain as a 7 out of 10.  No new trauma.  Depending on relief and longevity will follow up with Dr. Jean Rosenthal.  ROS Otherwise per HPI.  Assessment & Plan: Visit Diagnoses:  1. Pain in right hip     Plan: No additional findings.   Meds & Orders: No orders of the defined types were placed in this encounter.   Orders Placed This Encounter  Procedures  . Large Joint Inj: R hip joint  . XR C-ARM NO REPORT    Follow-up: Return for visit to requesting physician as needed.   Procedures: Large Joint Inj: R hip joint on 08/09/2019 2:40 PM Indications: pain and diagnostic evaluation Details: 22 G needle, anterior approach  Arthrogram: Yes  Medications: 80 mg triamcinolone acetonide 40 MG/ML; 4 mL bupivacaine 0.25 % Outcome: tolerated well, no immediate complications  Arthrogram demonstrated excellent flow of contrast throughout the joint surface without extravasation or obvious defect.  The patient had relief of symptoms during the anesthetic phase of the injection.  Procedure, treatment alternatives, risks and benefits explained, specific risks discussed. Consent was given by the patient. Immediately prior to procedure a time out was called to verify the correct patient, procedure, equipment, support staff and site/side marked as required. Patient was prepped and draped in the usual sterile fashion.      No  notes on file   Clinical History: No specialty comments available.     Objective:  VS:  HT:    WT:   BMI:     BP:   HR: bpm  TEMP: ( )  RESP:  Physical Exam Constitutional:      General: She is not in acute distress.    Appearance: Normal appearance. She is not ill-appearing.  HENT:     Head: Normocephalic and atraumatic.     Right Ear: External ear normal.     Left Ear: External ear normal.  Eyes:     Extraocular Movements: Extraocular movements intact.  Cardiovascular:     Rate and Rhythm: Normal rate.     Pulses: Normal pulses.  Musculoskeletal:     Right lower leg: No edema.     Left lower leg: No edema.     Comments: Patient has good distal strength with no pain over the greater trochanters.  Painful range of motion of the right hip.  No clonus or focal weakness.  Skin:    Findings: No erythema, lesion or rash.  Neurological:     General: No focal deficit present.     Mental Status: She is alert and oriented to person, place, and time.     Sensory: No sensory deficit.     Motor: No weakness or abnormal muscle tone.     Coordination: Coordination normal.     Gait: Gait abnormal.  Psychiatric:        Mood and  Affect: Mood normal.        Behavior: Behavior normal.     Ortho Exam Imaging: No results found.

## 2019-10-14 DIAGNOSIS — R011 Cardiac murmur, unspecified: Secondary | ICD-10-CM | POA: Diagnosis not present

## 2019-10-14 DIAGNOSIS — R531 Weakness: Secondary | ICD-10-CM | POA: Diagnosis not present

## 2019-10-20 ENCOUNTER — Ambulatory Visit (INDEPENDENT_AMBULATORY_CARE_PROVIDER_SITE_OTHER): Payer: Medicare Other | Admitting: Cardiology

## 2019-10-20 ENCOUNTER — Other Ambulatory Visit: Payer: Self-pay

## 2019-10-20 ENCOUNTER — Encounter: Payer: Self-pay | Admitting: Cardiology

## 2019-10-20 VITALS — BP 171/88 | HR 80 | Temp 97.3°F | Ht 69.0 in | Wt 142.0 lb

## 2019-10-20 DIAGNOSIS — Z7189 Other specified counseling: Secondary | ICD-10-CM

## 2019-10-20 DIAGNOSIS — I10 Essential (primary) hypertension: Secondary | ICD-10-CM | POA: Diagnosis not present

## 2019-10-20 DIAGNOSIS — R42 Dizziness and giddiness: Secondary | ICD-10-CM

## 2019-10-20 DIAGNOSIS — R011 Cardiac murmur, unspecified: Secondary | ICD-10-CM

## 2019-10-20 NOTE — Patient Instructions (Signed)
Medication Instructions:  NO CHANGES, CONTINUE WITH CURRENT MEDICATIONS *If you need a refill on your cardiac medications before your next appointment, please call your pharmacy*  Lab Work: NONE If you have labs (blood work) drawn today and your tests are completely normal, you will receive your results only by: Marland Kitchen MyChart Message (if you have MyChart) OR . A paper copy in the mail If you have any lab test that is abnormal or we need to change your treatment, we will call you to review the results.  Testing/Procedures: Your physician has requested that you have an echocardiogram. Echocardiography is a painless test that uses sound waves to create images of your heart. It provides your doctor with information about the size and shape of your heart and how well your heart's chambers and valves are working. This procedure takes approximately one hour. There are no restrictions for this procedure.  [place]  Follow-Up: At [facility], you and your health needs are our priority.  As part of our continuing mission to provide you with exceptional heart care, we have created designated Provider Care Teams.  These Care Teams include your primary Cardiologist (physician) and Advanced Practice Providers (APPs -  Physician Assistants and Nurse Practitioners) who all work together to provide you with the care you need, when you need it.  Your next appointment:   3 month(s)  The format for your next appointment:   In Person  Provider:   Buford Dresser, MD

## 2019-10-20 NOTE — Progress Notes (Signed)
Cardiology Office Note:    Date:  10/20/2019   ID:  Danielle Villegas, DOB 28-Oct-1955, MRN [id number]  PCP:  Lawerance Cruel, MD  Cardiologist:  Buford Dresser, MD  Referring MD: Lawerance Cruel, MD   CC: new patient evaluation for syncope and murmur  History of Present Illness:    Danielle Villegas is a 64 y.o. female with a hx of rheumatoid arthritis, allergies, factor 5 leiden with history of DVTwho is seen as a new consult at the request of Lawerance Cruel, MD for the evaluation and management of syncope and murmur.  Notes reviewed from recent visit with Dr. Harrington Challenger on 10/14/19. Patient has felt like the room is "dropping" for about 2 mos. Not a spinning sensation, more of feeling like the room is moving and she needs to brace herself. Occurs with both eyes open and eyes closed.  Noted on exam to have 3/6 SM radiating to bilateral carotids.  She notes that several months ago, began having episodes where she has a whole body sensation of heaviness. After that, she is sitting still, and she can see the room dropping in front of her. Lasts several seconds. She had an ear workup in the past, but this was before she had symptoms. No triggers that she has been able to find. Not related to time of day, position, activity, etc. Sometimes happens several times in one day, but recently hasn't happened in 5-6 days. Has increased hydration, hasn't helped.   Has been on BP medication for about a year. Ranges 110s/60s-170s/80s. Doesn't have a home cuff, checks when she is at the store.   Last 2.5 years have been extremely stressful for her. Husband passed away from MI, had 99% blockage. She did CPR, EMS shocked him, was in ICU, but had brain swelling and couldn't recover. Daughter moved home at the time due to an abusive relationship. Daughter has a variety of mental health issues, cannot work, she is supporting her.  Has fibromyalgia, lupus, and rheumatoid arthritis. On chronic  prednisone. Was very severe about 25 years ago, was on methotrexate but had severe reaction. Was told she had septic shock and double pneumonia, was on life support for 2.5 weeks. Had a seizure and a stroke. Full hospitalization course was 2.5 mos. Was here at [facility].   ROS severe fatigue, it is such an effort to do any activity. Has congestion, she thinks it is allergies, worse in the morning. Also endorses poor memory for some time.   Rare mild chest discomfort with extreme stress. None with exertion. No syncope. Better with deep breathing.   Past Medical History:  Diagnosis Date  . Anemia    hx of   . Anxiety   . Basal cell carcinoma   . DVT (deep venous thrombosis) ([place])    08/2012 in left leg   . Environmental allergies   . Factor 5 Leiden mutation, heterozygous ([place])   . Fibromyalgia   . Lupus ([place])   . Pneumonia    hx of 22 years ago   . RA (rheumatoid arthritis) ([place])   . Seizure ([place])    1997  . Stroke ([place])    1997  . UTI (urinary tract infection)    hx of at age 19     Past Surgical History:  Procedure Laterality Date  . ivc filter     . KNEE ARTHROSCOPY     left  and to repair   . KNEE CLOSED REDUCTION Left 01/25/2013  Procedure: CLOSED MANIPULATION KNEE;  Surgeon: Mauri Pole, MD;  Location: [facility] ORS;  Service: Orthopedics;  Laterality: Left;  . TONSILLECTOMY AND ADENOIDECTOMY      Current Medications: Current Outpatient Medications on File Prior to Visit  Medication Sig  . albuterol (PROVENTIL HFA;VENTOLIN HFA) 108 (90 Base) MCG/ACT inhaler Inhale into the lungs.  Marland Kitchen aspirin 81 MG tablet Take 81 mg by mouth 2 (two) times daily.   . cetirizine (ZYRTEC) 10 MG tablet Take 10 mg by mouth daily.  . cholecalciferol (VITAMIN D) 1000 UNITS tablet Take 1,000 Units by mouth daily.  Marland Kitchen HYDROcodone-acetaminophen (NORCO) 10-325 MG per tablet Take 1-2 tablets by mouth every 4 (four) hours as needed for pain. For pain.  Marland Kitchen irbesartan (AVAPRO) 150 MG tablet Take 150 mg by  mouth daily.  . meloxicam (MOBIC) 7.5 MG tablet Take 1 tablet by mouth daily.   . Misc Natural Products (LUTEIN 20 PO) Take 20 mg by mouth daily.  . Multiple Vitamin (MULTIVITAMIN WITH MINERALS) TABS Take 1 tablet by mouth daily.  Marland Kitchen omega-3 acid ethyl esters (LOVAZA) 1 G capsule Take by mouth 2 (two) times daily.  . predniSONE (DELTASONE) 1 MG tablet TAKE 2 TABLETS BY MOUTH EVERY DAY (TAKE WITH 5MG  EQUAL TO 7MG )  . predniSONE (DELTASONE) 5 MG tablet TAKE 1 TABLET BY MOUTH EVERY MORNING -TAKE WITH TWO 1MG  TABLETS TO EQUAL DOSE OF 7MG    No current facility-administered medications on file prior to visit.     Allergies:   Dilantin [phenytoin sodium extended] and Sulfa antibiotics   Social History   Tobacco Use  . Smoking status: Never Smoker  . Smokeless tobacco: Never Used  Substance Use Topics  . Alcohol use: Yes    Comment: Rarely  . Drug use: No    Family History: family history includes Alcohol abuse in her unknown relative; Arthritis in her unknown relative; Breast cancer in her unknown relative; Heart disease in her unknown relative; Hypertension in her unknown relative; Prostate cancer in her father; Stroke in her unknown relative.  ROS:   Please see the history of present illness.  Additional pertinent ROS: Constitutional: Negative for chills, fever, night sweats, unintentional weight loss  HENT: Negative for ear pain and hearing loss.   Eyes: Negative for loss of vision and eye pain.  Respiratory: Negative for cough, sputum, wheezing.   Cardiovascular: See HPI. Gastrointestinal: Negative for abdominal pain, melena, and hematochezia.  Genitourinary: Negative for dysuria and hematuria.  Musculoskeletal: Negative for falls and myalgias.  Skin: Negative for itching and rash.  Neurological: Negative for focal weakness, focal sensory changes and loss of consciousness.  Endo/Heme/Allergies: Does not bruise/bleed easily.     EKGs/Labs/Other Studies Reviewed:    The  following studies were reviewed today: No available cardiac studies  EKG:  EKG is personally reviewed.  The ekg ordered today demonstrates NSR with significant artifact  Recent Labs: No results found for requested labs within last 8760 hours.  Recent Lipid Panel    Component Value Date/Time   CHOL 227 (H) 06/18/2013 1111   TRIG 190.0 (H) 06/18/2013 1111   HDL 75.40 06/18/2013 1111   CHOLHDL 3 06/18/2013 1111   VLDL 38.0 06/18/2013 1111   LDLDIRECT 124.0 06/18/2013 1111    Physical Exam:    VS:  BP (!) 171/88   Pulse 80   Temp (!) 97.3 F (36.3 C)   Ht 5\' 9"  (1.753 m)   Wt 142 lb (64.4 kg)   SpO2 100%  BMI 20.97 kg/m    Orthostatics: Lying 153/89, HR 74 Sitting 161/94, HR 74 Standing 142/83, HR 87 Standing 3 min 143/93, HR 81  Wt Readings from Last 3 Encounters:  10/20/19 142 lb (64.4 kg)  09/24/13 148 lb (67.1 kg)  06/23/13 149 lb (67.6 kg)    GEN: Well nourished, well developed in no acute distress HEENT: Normal, moist mucous membranes NECK: No JVD CARDIAC: regular rhythm, normal S1 and S2, no rubs or gallops. 3/6 SEM. VASCULAR: Radial and DP pulses 2+ bilaterally.  RESPIRATORY:  Clear to auscultation without rales, wheezing or rhonchi  ABDOMEN: Soft, non-tender, non-distended MUSCULOSKELETAL:  Ambulates independently SKIN: Warm and dry, no edema NEUROLOGIC:  Alert and oriented x 3. No focal neuro deficits noted. PSYCHIATRIC:  Normal affect    ASSESSMENT:    1. Murmur   2. Dizziness of unknown etiology   3. Essential hypertension   4. Cardiac risk counseling   5. Counseling on health promotion and disease prevention    PLAN:    Presyncope/dizziness, murmur -orthostatics negative -ECG with artifact but NSR -We spent significant time today reviewing different parts of the cardiovascular system (electrical, vascular, functional, and valvular). We discussed how each of these systems can present with different symptoms. We reviewed that there are  different ways we evaluate these symptoms with tests. We reviewed which tests I think are most appropriate given the symptoms, and we discussed risks/benefits and limitations of each of these tests. Please see summary below. We also discussed that if testing is unrevealing for a cardiac cause of the symptoms, there are many noncardiac causes as well that can contribute to symptoms. If the heart is ruled out, then I recommend returning to PCP to discuss alternative diagnoses. -based on shared decision making, will starting with echocardiogram to evaluate -if echo unrevealing and symptoms recur/worse, she is willing to consider stress test and monitor. Wishes to start with echo only for now.  Sensation of room moving: it is as if the room is moving/rolling over. Not a room spinning horizontally but more vertically -if cardiac workup unremarkable, may need vestibular workup  Hypertension: initially very elevated, somewhat improved on recheck -on irbesartan 150 mg daily -does not check home BP. She will try to start taking numbers -goal <130/80 -if elevated on follow up will need to adjust medications  Cardiac risk counseling and prevention recommendations: -recommend heart healthy/Mediterranean diet, with whole grains, fruits, vegetable, fish, lean meats, nuts, and olive oil. Limit salt. -recommend moderate walking, 3-5 times/week for 30-50 minutes each session. Aim for at least 150 minutes.week. Goal should be pace of 3 miles/hours, or walking 1.5 miles in 30 minutes -recommend avoidance of tobacco products. Avoid excess alcohol.  Plan for follow up: 3 mos or sooner PRN  Medication Adjustments/Labs and Tests Ordered: Current medicines are reviewed at length with the patient today.  Concerns regarding medicines are outlined above.  Orders Placed This Encounter  Procedures  . EKG 12-Lead  . ECHOCARDIOGRAM COMPLETE   No orders of the defined types were placed in this encounter.   Patient  Instructions  Medication Instructions:  NO CHANGES, CONTINUE WITH CURRENT MEDICATIONS *If you need a refill on your cardiac medications before your next appointment, please call your pharmacy*  Lab Work: NONE If you have labs (blood work) drawn today and your tests are completely normal, you will receive your results only by: Marland Kitchen MyChart Message (if you have MyChart) OR . A paper copy in the mail If you have any  lab test that is abnormal or we need to change your treatment, we will call you to review the results.  Testing/Procedures: Your physician has requested that you have an echocardiogram. Echocardiography is a painless test that uses sound waves to create images of your heart. It provides your doctor with information about the size and shape of your heart and how well your heart's chambers and valves are working. This procedure takes approximately one hour. There are no restrictions for this procedure.  [place]  Follow-Up: At [facility], you and your health needs are our priority.  As part of our continuing mission to provide you with exceptional heart care, we have created designated Provider Care Teams.  These Care Teams include your primary Cardiologist (physician) and Advanced Practice Providers (APPs -  Physician Assistants and Nurse Practitioners) who all work together to provide you with the care you need, when you need it.  Your next appointment:   3 month(s)  The format for your next appointment:   In Person  Provider:   Buford Dresser, MD     Signed, Buford Dresser, MD PhD 10/20/2019  [place]

## 2019-10-24 ENCOUNTER — Encounter: Payer: Self-pay | Admitting: Cardiology

## 2019-10-25 MED ORDER — TRIAMCINOLONE ACETONIDE 40 MG/ML IJ SUSP
80.0000 mg | INTRAMUSCULAR | Status: AC | PRN
  Administered 2019-08-09: 80 mg via INTRA_ARTICULAR

## 2019-10-25 MED ORDER — BUPIVACAINE HCL 0.25 % IJ SOLN
4.0000 mL | INTRAMUSCULAR | Status: AC | PRN
  Administered 2019-08-09: 4 mL via INTRA_ARTICULAR

## 2019-11-01 ENCOUNTER — Ambulatory Visit (HOSPITAL_COMMUNITY): Payer: Medicare Other | Attending: Cardiology

## 2019-11-01 ENCOUNTER — Other Ambulatory Visit: Payer: Self-pay

## 2019-11-01 DIAGNOSIS — R011 Cardiac murmur, unspecified: Secondary | ICD-10-CM

## 2019-11-25 DIAGNOSIS — M0609 Rheumatoid arthritis without rheumatoid factor, multiple sites: Secondary | ICD-10-CM | POA: Diagnosis not present

## 2019-11-25 DIAGNOSIS — Z79899 Other long term (current) drug therapy: Secondary | ICD-10-CM | POA: Diagnosis not present

## 2019-12-22 DIAGNOSIS — H26493 Other secondary cataract, bilateral: Secondary | ICD-10-CM | POA: Diagnosis not present

## 2020-01-12 DIAGNOSIS — H26493 Other secondary cataract, bilateral: Secondary | ICD-10-CM | POA: Diagnosis not present

## 2020-01-20 ENCOUNTER — Ambulatory Visit (INDEPENDENT_AMBULATORY_CARE_PROVIDER_SITE_OTHER): Payer: Medicare Other | Admitting: Cardiology

## 2020-01-20 ENCOUNTER — Other Ambulatory Visit: Payer: Self-pay

## 2020-01-20 ENCOUNTER — Encounter: Payer: Self-pay | Admitting: Cardiology

## 2020-01-20 VITALS — BP 124/82 | HR 84 | Temp 97.2°F | Resp 98 | Ht 69.0 in | Wt 144.0 lb

## 2020-01-20 DIAGNOSIS — R011 Cardiac murmur, unspecified: Secondary | ICD-10-CM | POA: Diagnosis not present

## 2020-01-20 DIAGNOSIS — Z712 Person consulting for explanation of examination or test findings: Secondary | ICD-10-CM

## 2020-01-20 DIAGNOSIS — I10 Essential (primary) hypertension: Secondary | ICD-10-CM | POA: Diagnosis not present

## 2020-01-20 DIAGNOSIS — I350 Nonrheumatic aortic (valve) stenosis: Secondary | ICD-10-CM

## 2020-01-20 DIAGNOSIS — Z7189 Other specified counseling: Secondary | ICD-10-CM | POA: Diagnosis not present

## 2020-01-20 NOTE — Progress Notes (Signed)
Cardiology Office Note:    Date:  01/20/2020   ID:  Danielle Villegas, DOB 1955/11/13, MRN [id number]  PCP:  Lawerance Cruel, MD  Cardiologist:  Buford Dresser, MD  Referring MD: Lawerance Cruel, MD   CC: follow up  History of Present Illness:    Danielle Villegas is a 64 y.o. female with a hx of rheumatoid arthritis, allergies, factor 5 leiden with history of DVT who is seen for follow up today. She was initially seen 10/21/19 as a new consult at the request of Lawerance Cruel, MD for the evaluation and management of syncope and murmur.  Today: Has not had additional chest "thumping" or discomfort since our last visit. No clear lifestyle changes or other things to which this might be attributed. Has very rare chest tightness, not severe, only when upset. Goes away on its own without treatment. No associated symptoms. Longest episode minutes, usually briefer.  Dizzyness/vertigo symptoms completely gone.   Reviewed echo test results. Largely normal except for mild aortic stenosis/trivial AI with thickened aortic valve. Reports that her mother in law had valve issues that needed ballooning in the late 80s/early 90s. She died shortly thereafter of MI but did well with the ballooning.  Denies chest pain, shortness of breath at rest or with normal exertion. No PND, orthopnea, LE edema or unexpected weight gain. No syncope or palpitations.  Past Medical History:  Diagnosis Date  . Anemia    hx of   . Anxiety   . Basal cell carcinoma   . DVT (deep venous thrombosis) ([place])    08/2012 in left leg   . Environmental allergies   . Factor 5 Leiden mutation, heterozygous ([place])   . Fibromyalgia   . Lupus ([place])   . Pneumonia    hx of 22 years ago   . RA (rheumatoid arthritis) ([place])   . Seizure ([place])    1997  . Stroke ([place])    1997  . UTI (urinary tract infection)    hx of at age 79     Past Surgical History:  Procedure Laterality Date  . ivc filter     . KNEE  ARTHROSCOPY     left  and to repair   . KNEE CLOSED REDUCTION Left 01/25/2013   Procedure: CLOSED MANIPULATION KNEE;  Surgeon: Mauri Pole, MD;  Location: [facility];  Service: Orthopedics;  Laterality: Left;  . TONSILLECTOMY AND ADENOIDECTOMY      Current Medications: Current Outpatient Medications on File Prior to Visit  Medication Sig  . Ascorbic Acid (VITAMIN C) 100 MG CHEW Vitamin C  1000mg   . aspirin 81 MG tablet Take 81 mg by mouth 2 (two) times daily.   . Cetirizine HCl (ZYRTEC ALLERGY) 10 MG CAPS Zyrtec 10 mg capsule  Take 10 mg every day by oral route.  . cholecalciferol (VITAMIN D) 1000 UNITS tablet Take 1,000 Units by mouth daily.  . Fluticasone Propionate (FLONASE ALLERGY RELIEF NA) Flonase Allergy Relief  . irbesartan (AVAPRO) 150 MG tablet Take 150 mg by mouth daily.  . Multiple Vitamin (MULTI-VITAMIN) tablet Take by mouth.  . predniSONE (DELTASONE) 1 MG tablet TAKE 2 TABLETS BY MOUTH EVERY DAY (TAKE WITH 5MG  EQUAL TO 7MG )  . predniSONE (DELTASONE) 5 MG tablet TAKE 1 TABLET BY MOUTH EVERY MORNING -TAKE WITH TWO 1MG  TABLETS TO EQUAL DOSE OF 7MG    No current facility-administered medications on file prior to visit.     Allergies:   Dilantin [phenytoin sodium extended],  Methotrexate, and Sulfa antibiotics   Social History   Tobacco Use  . Smoking status: Never Smoker  . Smokeless tobacco: Never Used  Substance Use Topics  . Alcohol use: Yes    Comment: Rarely  . Drug use: No    Family History: family history includes Alcohol abuse in her unknown relative; Arthritis in her unknown relative; Breast cancer in her unknown relative; Heart disease in her unknown relative; Hypertension in her unknown relative; Prostate cancer in her father; Stroke in her unknown relative.  ROS:   Please see the history of present illness.  Additional pertinent ROS otherwise unremarkable.   EKGs/Labs/Other Studies Reviewed:    The following studies were reviewed today: Echo  11/01/19 1. The average left ventricular global longitudinal strain is -21.4 %.  2. Left ventricular ejection fraction, by visual estimation, is 65 to  70%. The left ventricle has normal function. There is no left ventricular  hypertrophy.  3. The left ventricle has no regional wall motion abnormalities.  4. Global right ventricle has normal systolic function.The right  ventricular size is normal. No increase in right ventricular wall  thickness.  5. Left atrial size was normal.  6. Right atrial size was normal.  7. The mitral valve is normal in structure. Trivial mitral valve  regurgitation. No evidence of mitral stenosis.  8. The tricuspid valve is normal in structure. Tricuspid valve  regurgitation is mild.  9. The aortic valve is tricuspid. There is moderate thickening and  moderate calcification of the aortic valve. Aortic valve mean gradient  measures 16.0 mmHg. Aortic valve peak gradient measures 28.5 mmHg. Aortic  valve area, by VTI measures 1.04 cm.  Dimensionless index is 0.41. There is mild aortic stenosis and trivial  aortic insufficiency.  10. The pulmonic valve was normal in structure. Pulmonic valve  regurgitation is trivial.  11. Normal pulmonary artery systolic pressure.  12. The inferior vena cava is normal in size with greater than 50%  respiratory variability, suggesting right atrial pressure of 3 mmHg.   EKG:  EKG is personally reviewed.  The ekg ordered 10/20/19 demonstrates NSR with significant artifact  Recent Labs: No results found for requested labs within last 8760 hours.  Recent Lipid Panel    Component Value Date/Time   CHOL 227 (H) 06/18/2013 1111   TRIG 190.0 (H) 06/18/2013 1111   HDL 75.40 06/18/2013 1111   CHOLHDL 3 06/18/2013 1111   VLDL 38.0 06/18/2013 1111   LDLDIRECT 124.0 06/18/2013 1111    Physical Exam:    VS:  BP 124/82   Pulse 84   Temp (!) 97.2 F (36.2 C)   Resp (!) 98   Ht 5\' 9"  (1.753 m)   Wt 144 lb (65.3 kg)   BMI  21.27 kg/m     Wt Readings from Last 3 Encounters:  01/20/20 144 lb (65.3 kg)  10/20/19 142 lb (64.4 kg)  09/24/13 148 lb (67.1 kg)    GEN: Well nourished, well developed in no acute distress HEENT: Normal, moist mucous membranes NECK: No JVD CARDIAC: regular rhythm, normal S1 and S2, no rubs or gallops. 2/6 SE murmur. VASCULAR: Radial and DP pulses 2+ bilaterally. No carotid bruits RESPIRATORY:  Clear to auscultation without rales, wheezing or rhonchi  ABDOMEN: Soft, non-tender, non-distended MUSCULOSKELETAL:  Ambulates independently SKIN: Warm and dry, no edema NEUROLOGIC:  Alert and oriented x 3. No focal neuro deficits noted. PSYCHIATRIC:  Normal affect   ASSESSMENT:    1. Nonrheumatic aortic valve stenosis  2. Murmur   3. Essential hypertension   4. Cardiac risk counseling   5. Counseling on health promotion and disease prevention   6. Encounter to discuss test results    PLAN:    Vertigo: resolved  Mild aortic stenosis, with moderate calcification of the valve.  -murmur likely due to this -asymptomatic -reviewed red flag warning signs -monitor periodically.  Hypertension: well controlled -on irbesartan 150 mg daily -Bp well controlled at home. -goal <130/80  Cardiac risk counseling and prevention recommendations: -recommend heart healthy/Mediterranean diet, with whole grains, fruits, vegetable, fish, lean meats, nuts, and olive oil. Limit salt. -recommend moderate walking, 3-5 times/week for 30-50 minutes each session. Aim for at least 150 minutes.week. Goal should be pace of 3 miles/hours, or walking 1.5 miles in 30 minutes -recommend avoidance of tobacco products. Avoid excess alcohol.  Plan for follow up: 2 years or sooner PRN. Will need to monitor valve if change in symptoms or ~every 5 years  Medication Adjustments/Labs and Tests Ordered: Current medicines are reviewed at length with the patient today.  Concerns regarding medicines are outlined above.   No orders of the defined types were placed in this encounter.  No orders of the defined types were placed in this encounter.   Patient Instructions  Medication Instructions:  Your Physician recommend you continue on your current medication as directed.    *If you need a refill on your cardiac medications before your next appointment, please call your pharmacy*   Lab Work: None   Testing/Procedures: None   Follow-Up: At [facility], you and your health needs are our priority.  As part of our continuing mission to provide you with exceptional heart care, we have created designated Provider Care Teams.  These Care Teams include your primary Cardiologist (physician) and Advanced Practice Providers (APPs -  Physician Assistants and Nurse Practitioners) who all work together to provide you with the care you need, when you need it.  We recommend signing up for the patient portal called "MyChart".  Sign up information is provided on this After Visit Summary.  MyChart is used to connect with patients for Virtual Visits (Telemedicine).  Patients are able to view lab/test results, encounter notes, upcoming appointments, etc.  Non-urgent messages can be sent to your provider as well.   To learn more about what you can do with MyChart, go to [email or web address].    Your next appointment:   2 year(s)  The format for your next appointment:   In Person  Provider:   Buford Dresser, MD     Signed, Buford Dresser, MD PhD 01/20/2020  [place]

## 2020-01-20 NOTE — Patient Instructions (Signed)
Medication Instructions:  Your Physician recommend you continue on your current medication as directed.    *If you need a refill on your cardiac medications before your next appointment, please call your pharmacy*   Lab Work: None   Testing/Procedures: None   Follow-Up: At [facility], you and your health needs are our priority.  As part of our continuing mission to provide you with exceptional heart care, we have created designated Provider Care Teams.  These Care Teams include your primary Cardiologist (physician) and Advanced Practice Providers (APPs -  Physician Assistants and Nurse Practitioners) who all work together to provide you with the care you need, when you need it.  We recommend signing up for the patient portal called "MyChart".  Sign up information is provided on this After Visit Summary.  MyChart is used to connect with patients for Virtual Visits (Telemedicine).  Patients are able to view lab/test results, encounter notes, upcoming appointments, etc.  Non-urgent messages can be sent to your provider as well.   To learn more about what you can do with MyChart, go to [email or web address].    Your next appointment:   2 year(s)  The format for your next appointment:   In Person  Provider:   Bridgette Christopher, MD     

## 2020-01-21 ENCOUNTER — Encounter: Payer: Self-pay | Admitting: Cardiology

## 2020-01-21 DIAGNOSIS — I350 Nonrheumatic aortic (valve) stenosis: Secondary | ICD-10-CM | POA: Insufficient documentation

## 2020-04-10 ENCOUNTER — Telehealth: Payer: Self-pay | Admitting: Physical Medicine and Rehabilitation

## 2020-04-10 NOTE — Telephone Encounter (Signed)
Patient called.   She wanted to know how long the recovery period typically is for a hip replacement. She also wanted to know if she could get a handicap placard.   Call back: [phone]

## 2020-04-10 NOTE — Telephone Encounter (Signed)
Please advise 

## 2020-04-11 NOTE — Telephone Encounter (Signed)
Spoke with and informed patient of Dr.Blackman's message. Appointment made for her to come in.

## 2020-04-11 NOTE — Telephone Encounter (Signed)
Per Ninfa Linden she needs appt before surgery or handicap placard. We haven't seen her in years

## 2020-04-11 NOTE — Telephone Encounter (Signed)
Since we have not seen her since January 2019 (now over 2 and a half years ago), we need to see her first before we can do anything - scheduling surgery or handicap placard.

## 2020-04-11 NOTE — Telephone Encounter (Signed)
I will answer her hip related questions, I just wanted to ask you about a handicap placard, we haven't seen her since 2019

## 2020-04-17 DIAGNOSIS — Z0000 Encounter for general adult medical examination without abnormal findings: Secondary | ICD-10-CM | POA: Diagnosis not present

## 2020-04-20 DIAGNOSIS — E7800 Pure hypercholesterolemia, unspecified: Secondary | ICD-10-CM | POA: Diagnosis not present

## 2020-04-20 DIAGNOSIS — R011 Cardiac murmur, unspecified: Secondary | ICD-10-CM | POA: Diagnosis not present

## 2020-04-20 DIAGNOSIS — M069 Rheumatoid arthritis, unspecified: Secondary | ICD-10-CM | POA: Diagnosis not present

## 2020-04-20 DIAGNOSIS — D6851 Activated protein C resistance: Secondary | ICD-10-CM | POA: Diagnosis not present

## 2020-04-20 DIAGNOSIS — Z95828 Presence of other vascular implants and grafts: Secondary | ICD-10-CM | POA: Diagnosis not present

## 2020-04-20 DIAGNOSIS — D6869 Other thrombophilia: Secondary | ICD-10-CM | POA: Diagnosis not present

## 2020-04-20 DIAGNOSIS — I10 Essential (primary) hypertension: Secondary | ICD-10-CM | POA: Diagnosis not present

## 2020-04-26 ENCOUNTER — Ambulatory Visit (INDEPENDENT_AMBULATORY_CARE_PROVIDER_SITE_OTHER): Payer: Medicare Other | Admitting: Orthopaedic Surgery

## 2020-04-26 ENCOUNTER — Encounter: Payer: Self-pay | Admitting: Orthopaedic Surgery

## 2020-04-26 ENCOUNTER — Ambulatory Visit (INDEPENDENT_AMBULATORY_CARE_PROVIDER_SITE_OTHER): Payer: Medicare Other

## 2020-04-26 VITALS — Ht 67.32 in | Wt 141.6 lb

## 2020-04-26 DIAGNOSIS — M25551 Pain in right hip: Secondary | ICD-10-CM

## 2020-04-26 DIAGNOSIS — M1611 Unilateral primary osteoarthritis, right hip: Secondary | ICD-10-CM

## 2020-04-26 MED ORDER — MELOXICAM 15 MG PO TABS: 15.0000 mg | ORAL_TABLET | Freq: Every day | ORAL | 1 refills | Status: AC | PRN

## 2020-04-26 NOTE — Progress Notes (Signed)
Office Visit Note   Patient: Danielle Villegas           Date of Birth: Oct 02, 1955           MRN: [id number] Visit Date: 04/26/2020              Requested by: Lawerance Cruel, [place],  [place] [address] PCP: Lawerance Cruel, MD   Assessment & Plan: Visit Diagnoses:  1. Pain in right hip   2. Unilateral primary osteoarthritis, right hip     Plan: We did discuss hip replacement surgery in detail today.  I talked about the risk and benefits of surgery.  We went over x-rays in detail and discussed the interoperative and postoperative course and what to expect.  All question concerns were answered and addressed.  She has our surgery scheduler's card and she will let us know when she would like to schedule a right total hip arthroplasty.  Follow-Up Instructions: Return if symptoms worsen or fail to improve.   Orders:  Orders Placed This Encounter  Procedures  . XR HIP UNILAT W OR W/O PELVIS 2-3 VIEWS RIGHT   Meds ordered this encounter  Medications  . meloxicam (MOBIC) 15 MG tablet    Sig: Take 1 tablet (15 mg total) by mouth daily as needed for pain.    Dispense:  30 tablet    Refill:  1      Procedures: No procedures performed   Clinical Data: No additional findings.   Subjective: Chief Complaint  Patient presents with  . Right Hip - Pain  The patient has not seen over the years now with severe right hip pain that has been slowly getting worse.  She has had at least 2 intra-articular injections or more in the right hip joint under fluoroscopic guidance.  It is gotten to where her right hip pain is become significant worse.  She is developed some left hip pain but mainly to her right hip that is causing her to walk different at this point.  She says she is developed a constant ache.  Meloxicam is helped some so she would like a refill of this.  She is at the point where she cannot sleep and her right hip pain is definitely affecting her  mobility, her quality of life and her actives daily living.  She has tried treatment for several years now for this right hip conservatively and is now exhausted all conservative treatment measures and wants to talk about hip replacement surgery at some point.  She said no other acute change in her medical status.  HPI  Review of Systems She currently denies any headache, chest pain, shortness of breath, fever, chills, nausea, vomiting  Objective: Vital Signs: Ht 5' 7.32" (1.71 m)   Wt 141 lb 9.6 oz (64.2 kg)   BMI 21.97 kg/m   Physical Exam She is alert and orient x3 and in no acute distress Ortho Exam Examination of her right hip show significant stiffness in the hip joint with attempts at rotation.  Left hip rotates normally and feels smooth.  The right hip is a lot of pain with attempts of rotation. Specialty Comments:  No specialty comments available.  Imaging: XR HIP UNILAT W OR W/O PELVIS 2-3 VIEWS RIGHT  Result Date: 04/26/2020 An AP pelvis and lateral of the right hip shows severe arthritis of the right hip with joint space narrowing as well as para-articular osteophytes and sclerotic changes.  This is  worsened when compared to previous x-rays.    PMFS History: Patient Active Problem List   Diagnosis Date Noted  . Unilateral primary osteoarthritis, right hip 04/26/2020  . Nonrheumatic aortic valve stenosis 01/21/2020  . Routine general medical examination at a health care facility 06/18/2013  . Post-op stiffness of left TKA 01/25/2013  . Autoimmune disease, not elsewhere classified(279.49) 01/10/2013  . Reactive depression (situational) 01/10/2013  . Elevated BP 01/10/2013  . Thrombocytopenia ([place]) 09/13/2012  . Anemia 09/13/2012  . Lower leg DVT (deep venous thromboembolism), acute ([place]) 09/12/2012  . Patellar dislocation 09/11/2012  . Fever 09/11/2012  . Memory loss 04/13/2012  . Generalized headaches 04/13/2012  . Otalgia 04/13/2012   Past Medical History:    Diagnosis Date  . Anemia    hx of   . Anxiety   . Basal cell carcinoma   . DVT (deep venous thrombosis) ([place])    08/2012 in left leg   . Environmental allergies   . Factor 5 Leiden mutation, heterozygous ([place])   . Fibromyalgia   . Lupus ([place])   . Pneumonia    hx of 22 years ago   . RA (rheumatoid arthritis) ([place])   . Seizure ([place])    1997  . Stroke ([place])    1997  . UTI (urinary tract infection)    hx of at age 69     Family History  Problem Relation Age of Onset  . Alcohol abuse Unknown   . Arthritis Unknown   . Breast cancer Unknown   . Prostate cancer Father   . Stroke Unknown   . Hypertension Unknown   . Heart disease Unknown     Past Surgical History:  Procedure Laterality Date  . ivc filter     . KNEE ARTHROSCOPY     left  and to repair   . KNEE CLOSED REDUCTION Left 01/25/2013   Procedure: CLOSED MANIPULATION KNEE;  Surgeon: Mauri Pole, MD;  Location: [facility];  Service: Orthopedics;  Laterality: Left;  . TONSILLECTOMY AND ADENOIDECTOMY     Social History   Occupational History  . Not on file  Tobacco Use  . Smoking status: Never Smoker  . Smokeless tobacco: Never Used  Substance and Sexual Activity  . Alcohol use: Yes    Comment: Rarely  . Drug use: No  . Sexual activity: Not on file

## 2020-05-02 ENCOUNTER — Telehealth: Payer: Self-pay | Admitting: Orthopaedic Surgery

## 2020-05-02 NOTE — Telephone Encounter (Signed)
Called patient and let her know that surgery scheduler schedules for several different providers  Patient states she is really wanting to get this scheduled for August

## 2020-05-02 NOTE — Telephone Encounter (Signed)
I called patient and scheduled surgery. 

## 2020-05-02 NOTE — Telephone Encounter (Signed)
Pt called stating she has been trying to get scheduled for surgery and has not had her messages responded to; pt would like for Dr.Blackman to be made aware of this and would like to receive a CB to schedule surgery.  [phone]

## 2020-05-04 ENCOUNTER — Telehealth: Payer: Self-pay | Admitting: *Deleted

## 2020-05-04 ENCOUNTER — Other Ambulatory Visit: Payer: Self-pay | Admitting: Family

## 2020-05-04 DIAGNOSIS — I824Z9 Acute embolism and thrombosis of unspecified deep veins of unspecified distal lower extremity: Secondary | ICD-10-CM

## 2020-05-04 NOTE — Telephone Encounter (Signed)
Message received from patient stating that she is having hip surgery next week and is in need of medical clearance from Dr. Marin Olp regarding Aspirin and need for Xarelto after surgery.  Dr. Marin Olp notified and would like for pt to come in for labs and to see Laverna Peace NP.  Message sent to scheduling.

## 2020-05-05 ENCOUNTER — Other Ambulatory Visit: Payer: Self-pay | Admitting: Physician Assistant

## 2020-05-05 ENCOUNTER — Other Ambulatory Visit: Payer: Self-pay

## 2020-05-05 ENCOUNTER — Inpatient Hospital Stay: Payer: Medicare Other

## 2020-05-05 ENCOUNTER — Encounter: Payer: Self-pay | Admitting: Family

## 2020-05-05 ENCOUNTER — Inpatient Hospital Stay: Payer: Medicare Other | Attending: Family | Admitting: Family

## 2020-05-05 VITALS — BP 149/76 | HR 77 | Temp 98.4°F | Resp 18 | Ht 67.0 in | Wt 142.0 lb

## 2020-05-05 DIAGNOSIS — Z7982 Long term (current) use of aspirin: Secondary | ICD-10-CM | POA: Insufficient documentation

## 2020-05-05 DIAGNOSIS — D6851 Activated protein C resistance: Secondary | ICD-10-CM

## 2020-05-05 DIAGNOSIS — I82442 Acute embolism and thrombosis of left tibial vein: Secondary | ICD-10-CM | POA: Diagnosis not present

## 2020-05-05 DIAGNOSIS — I824Z9 Acute embolism and thrombosis of unspecified deep veins of unspecified distal lower extremity: Secondary | ICD-10-CM

## 2020-05-05 LAB — CMP (CANCER CENTER ONLY)
ALT: 13 U/L (ref 0–44)
AST: 19 U/L (ref 15–41)
Albumin: 4.3 g/dL (ref 3.5–5.0)
Alkaline Phosphatase: 63 U/L (ref 38–126)
Anion gap: 8 (ref 5–15)
BUN: 27 mg/dL — ABNORMAL HIGH (ref 8–23)
CO2: 28 mmol/L (ref 22–32)
Calcium: 10.4 mg/dL — ABNORMAL HIGH (ref 8.9–10.3)
Chloride: 107 mmol/L (ref 98–111)
Creatinine: 0.96 mg/dL (ref 0.44–1.00)
GFR, Est AFR Am: >60 mL/min (ref >60)
GFR, Estimated: >60 mL/min (ref >60)
Glucose, Bld: 98 mg/dL (ref 70–99)
Potassium: 3.8 mmol/L (ref 3.5–5.1)
Sodium: 143 mmol/L (ref 135–145)
Total Bilirubin: 1.1 mg/dL (ref 0.3–1.2)
Total Protein: 6.8 g/dL (ref 6.5–8.1)

## 2020-05-05 LAB — CBC WITH DIFFERENTIAL (CANCER CENTER ONLY)
Abs Immature Granulocytes: 0.01 10*3/uL (ref 0.00–0.07)
Basophils Absolute: 0 10*3/uL (ref 0.0–0.1)
Basophils Relative: 0 %
Eosinophils Absolute: 0 10*3/uL (ref 0.0–0.5)
Eosinophils Relative: 0 %
HCT: 38.2 % (ref 36.0–46.0)
Hemoglobin: 12.5 g/dL (ref 12.0–15.0)
Immature Granulocytes: 0 %
Lymphocytes Relative: 9 %
Lymphs Abs: 0.4 10*3/uL — ABNORMAL LOW (ref 0.7–4.0)
MCH: 31.5 pg (ref 26.0–34.0)
MCHC: 32.7 g/dL (ref 30.0–36.0)
MCV: 96.2 fL (ref 80.0–100.0)
Monocytes Absolute: 0.4 10*3/uL (ref 0.1–1.0)
Monocytes Relative: 9 %
Neutro Abs: 3.6 10*3/uL (ref 1.7–7.7)
Neutrophils Relative %: 82 %
Platelet Count: 195 10*3/uL (ref 150–400)
RBC: 3.97 MIL/uL (ref 3.87–5.11)
RDW: 13.2 % (ref 11.5–15.5)
WBC Count: 4.5 10*3/uL (ref 4.0–10.5)
nRBC: 0 % (ref 0.0–0.2)

## 2020-05-05 LAB — D-DIMER, QUANTITATIVE: D-Dimer, Quant: 0.68 ug/mL-FEU — ABNORMAL HIGH (ref 0.00–0.50)

## 2020-05-05 MED ORDER — RIVAROXABAN 10 MG PO TABS: 10.0000 mg | ORAL_TABLET | Freq: Every day | ORAL | 1 refills | Status: DC

## 2020-05-05 NOTE — Progress Notes (Signed)
Hematology and Oncology Follow Up Visit  Danielle Villegas [id number] Apr 19, 1956 64 y.o. 05/05/2020   Principle Diagnosis:  Factor V Leiden mutation - heterozygote Deep venous thrombosis of the left posterior tibial vein Iron deficiency anemia   Current Therapy:   IVC filter Aspirin 162 mg PO daily IV iron as indicated    Interim History:  Danielle Villegas is here today for surgical clearance. She is scheduled to have a total right hip replacement in 1 week (05/12/2020).  She has done well on 2 baby aspirin daily and so far has not had any issues with recurrent thrombus.  She states that her IVC filter is permanently in place.  She has hemorrhoids and will occasionally have a little bright red blood on her toilet tissues if she strains. She had to reschedule her colonoscopy for November due to her hip replacement.  She has not noted any other bleeding. No bruising or petechiae.  No fever, chills, n/v, cough, rash, dizziness, SOB, chest pain, palpitations, abdominal pain or changes in bowel or bladder habits.  No swelling, numbness or tingling in her extremities at this time.  No falls or syncopal episodes to report.  She has maintained a good appetite and is staying well hydrated. Her weight is stable.   ECOG Performance Status: 0 - Asymptomatic  Medications:  Allergies as of 05/05/2020      Reactions   Methotrexate Other (See Comments)   Entire body started to shut down, on life support for 2.5 weeks   Dilantin [phenytoin Sodium Extended] Other (See Comments)   Dizziness   Sulfa Antibiotics Other (See Comments)   Body Stiffness      Medication List       Accurate as of May 05, 2020  1:46 PM. If you have any questions, ask your nurse or doctor.        ascorbic acid 500 MG tablet Commonly known as: VITAMIN C Take 500 mg by mouth daily.   aspirin 81 MG tablet Take 81 mg by mouth 2 (two) times daily.   cholecalciferol 1000 units tablet Commonly known as: VITAMIN  D Take 1,000 Units by mouth daily.   famotidine 20 MG tablet Commonly known as: PEPCID Take 20 mg by mouth daily as needed for heartburn or indigestion.   FLONASE ALLERGY RELIEF NA Place 1 spray into both nostrils daily as needed (allergies).   ibuprofen 200 MG tablet Commonly known as: ADVIL Take 400 mg by mouth every 6 (six) hours as needed for headache.   irbesartan 150 MG tablet Commonly known as: AVAPRO Take 150 mg by mouth daily.   Lutein 20 MG Tabs Take 20 mg by mouth daily.   Magnesium 250 MG Tabs Take 250 mg by mouth 2 (two) times a week. On Sun and Thurs   meloxicam 15 MG tablet Commonly known as: MOBIC Take 1 tablet (15 mg total) by mouth daily as needed for pain.   Multi-Vitamin tablet Take 1 tablet by mouth daily.   potassium gluconate 595 (99 K) MG Tabs tablet Take 595 mg by mouth 2 (two) times a week. On Tues and Sat   predniSONE 5 MG tablet Commonly known as: DELTASONE TAKE 1 TABLET BY MOUTH EVERY MORNING -TAKE WITH TWO 1MG  TABLETS TO EQUAL DOSE OF 7MG  What changed: See the new instructions.   predniSONE 1 MG tablet Commonly known as: DELTASONE TAKE 2 TABLETS BY MOUTH EVERY DAY (TAKE WITH 5MG  EQUAL TO 7MG ) What changed: See the new instructions.   Systane  Complete 0.6 % Soln Generic drug: Propylene Glycol Place 1 drop into both eyes daily as needed (dry eyes).   ZyrTEC Allergy 10 MG Caps Generic drug: Cetirizine HCl Take 10 mg by mouth daily.       Allergies:  Allergies  Allergen Reactions  . Methotrexate Other (See Comments)    Entire body started to shut down, on life support for 2.5 weeks  . Dilantin [Phenytoin Sodium Extended] Other (See Comments)    Dizziness  . Sulfa Antibiotics Other (See Comments)    Body Stiffness    Past Medical History, Surgical history, Social history, and Family History were reviewed and updated.  Review of Systems: All other 10 point review of systems is negative.   Physical Exam:  height is 5\' 7"   (1.702 m) and weight is 142 lb (64.4 kg). Her oral temperature is 98.4 F (36.9 C). Her blood pressure is 149/76 (abnormal) and her pulse is 77. Her respiration is 18 and oxygen saturation is 100%.   Wt Readings from Last 3 Encounters:  05/05/20 142 lb (64.4 kg)  04/26/20 141 lb 9.6 oz (64.2 kg)  01/20/20 144 lb (65.3 kg)    Ocular: Sclerae unicteric, pupils equal, round and reactive to light Ear-nose-throat: Oropharynx clear, dentition fair Lymphatic: No cervical, supraclavicular or axillary adenopathy Lungs no rales or rhonchi, good excursion bilaterally Heart regular rate and rhythm, no murmur appreciated Abd soft, nontender, positive bowel sounds, no liver or spleen tip palpated on exam, no fluid wave  MSK no focal spinal tenderness, no joint edema Neuro: non-focal, well-oriented, appropriate affect Breasts: Deferred   Lab Results  Component Value Date   WBC 4.5 05/05/2020   HGB 12.5 05/05/2020   HCT 38.2 05/05/2020   MCV 96.2 05/05/2020   PLT 195 05/05/2020   Lab Results  Component Value Date   FERRITIN 199 09/24/2013   IRON 68 09/24/2013   TIBC 308 09/24/2013   UIBC 240 09/24/2013   IRONPCTSAT 22 09/24/2013   Lab Results  Component Value Date   RETICCTPCT 1.3 09/24/2013   RBC 3.97 05/05/2020   RETICCTABS 52.8 09/24/2013   No results found for: KPAFRELGTCHN, LAMBDASER, KAPLAMBRATIO No results found for: IGGSERUM, IGA, IGMSERUM No results found for: Odetta Pink, SPEI   Chemistry      Component Value Date/Time   NA 141 06/18/2013 1111   K 3.5 06/18/2013 1111   CL 106 06/18/2013 1111   CO2 31 06/18/2013 1111   BUN 23 06/18/2013 1111   CREATININE 0.9 06/18/2013 1111      Component Value Date/Time   CALCIUM 9.4 06/18/2013 1111   ALKPHOS 66 06/18/2013 1111   AST 23 06/18/2013 1111   ALT 21 06/18/2013 1111   BILITOT 1.0 06/18/2013 1111       Impression and Plan: Danielle Villegas is a very pleasant 64 yo  caucasian female with factor V Leiden mutation - heterozygous and DVT of the left posterior tibial vein.  She has done well on 2 baby aspirin daily and has her IVC filter in place.  She will be having a right total hip replacement next week on Friday.  I spoke with Dr. Marin Olp and we will have her stop the aspirin and start Xarelto 10 mg PO daily today through Tuesday (05/09/2020) and restart Sunday (05/14/2020) for 6 weeks.  She verbalized understanding. I also wrote out directions on paper for her to take home. We will plan to see her again in 2 months.  She can contact our office with any questions or concerns. We can certainly see her sooner if needed.    Laverna Peace, NP 8/6/20211:46 PM

## 2020-05-08 NOTE — Patient Instructions (Addendum)
DUE TO COVID-19 ONLY ONE VISITOR IS ALLOWED TO COME WITH YOU AND STAY IN THE WAITING ROOM ONLY DURING PRE OP AND PROCEDURE DAY OF SURGERY. THE 1 VISITOR  MAY VISIT WITH YOU AFTER SURGERY IN YOUR PRIVATE ROOM DURING VISITING HOURS ONLY!  YOU NEED TO HAVE A COVID 19 TEST ON: 05/09/20@  1:10 pm, THIS TEST MUST BE DONE BEFORE SURGERY,  COVID TESTING SITE [place] [address], IT IS ON THE RIGHT GOING OUT [address] APPROXIMATELY  2 MINUTES PAST [employer] ON THE RIGHT. ONCE YOUR COVID TEST IS COMPLETED,  PLEASE BEGIN THE QUARANTINE INSTRUCTIONS AS OUTLINED IN YOUR HANDOUT.                Danielle Villegas   Your procedure is scheduled on: 05/12/20   Report to [facility] Main  Entrance   Report to admitting at: 8:30 AM     Call this number if you have problems the morning of surgery [phone]    Remember:   NO SOLID FOOD AFTER MIDNIGHT THE NIGHT PRIOR TO SURGERY. NOTHING BY MOUTH EXCEPT CLEAR LIQUIDS UNTIL: 8:00 am . PLEASE FINISH ENSURE DRINK PER SURGEON ORDER  WHICH NEEDS TO BE COMPLETED AT: 8:00 am .  CLEAR LIQUID DIET   Foods Allowed                                                                     Foods Excluded  Coffee and tea, regular and decaf                             liquids that you cannot  Plain Jell-O any favor except red or purple                                           see through such as: Fruit ices (not with fruit pulp)                                     milk, soups, orange juice  Iced Popsicles                                    All solid food Carbonated beverages, regular and diet                                    Cranberry, grape and apple juices Sports drinks like Gatorade Lightly seasoned clear broth or consume(fat free) Sugar, honey syrup  Sample Menu Breakfast                                Lunch  Supper Cranberry juice                    Beef broth                             Chicken broth Jell-O                                     Grape juice                           Apple juice Coffee or tea                        Jell-O                                      Popsicle                                                Coffee or tea                        Coffee or tea  _____________________________________________________________________   BRUSH YOUR TEETH MORNING OF SURGERY AND RINSE YOUR MOUTH OUT, NO CHEWING GUM CANDY OR MINTS.     Take these medicines the morning of surgery with A SIP OF WATER: prednisone,cetirizine,famotadine.Use Flonase as usual.                                You may not have any metal on your body including hair pins and              piercings  Do not wear jewelry, make-up, lotions, powders or perfumes, deodorant             Do not wear nail polish on your fingernails.  Do not shave  48 hours prior to surgery.          Do not bring valuables to the hospital. [place].  Contacts, dentures or bridgework may not be worn into surgery.  Leave suitcase in the car. After surgery it may be brought to your room.     Patients discharged the day of surgery will not be allowed to drive home. IF YOU ARE HAVING SURGERY AND GOING HOME THE SAME DAY, YOU MUST HAVE AN ADULT TO DRIVE YOU HOME AND BE WITH YOU FOR 24 HOURS. YOU MAY GO HOME BY TAXI OR UBER OR ORTHERWISE, BUT AN ADULT MUST ACCOMPANY YOU HOME AND STAY WITH YOU FOR 24 HOURS.  Name and phone number of your driver:  Special Instructions: N/A              Please read over the following fact sheets you were given: _____________________________________________________________________          [facility] - Preparing for Surgery Before surgery, you can play an important role.  Because skin is not sterile, your skin  needs to be as free of germs as possible.  You can reduce the number of germs on your skin by washing with CHG (chlorahexidine  gluconate) soap before surgery.  CHG is an antiseptic cleaner which kills germs and bonds with the skin to continue killing germs even after washing. Please DO NOT use if you have an allergy to CHG or antibacterial soaps.  If your skin becomes reddened/irritated stop using the CHG and inform your nurse when you arrive at Short Stay. Do not shave (including legs and underarms) for at least 48 hours prior to the first CHG shower.  You may shave your face/neck. Please follow these instructions carefully:  1.  Shower with CHG Soap the night before surgery and the  morning of Surgery.  2.  If you choose to wash your hair, wash your hair first as usual with your  normal  shampoo.  3.  After you shampoo, rinse your hair and body thoroughly to remove the  shampoo.                           4.  Use CHG as you would any other liquid soap.  You can apply chg directly  to the skin and wash                       Gently with a scrungie or clean washcloth.  5.  Apply the CHG Soap to your body ONLY FROM THE NECK DOWN.   Do not use on face/ open                           Wound or open sores. Avoid contact with eyes, ears mouth and genitals (private parts).                       Wash face,  Genitals (private parts) with your normal soap.             6.  Wash thoroughly, paying special attention to the area where your surgery  will be performed.  7.  Thoroughly rinse your body with warm water from the neck down.  8.  DO NOT shower/wash with your normal soap after using and rinsing off  the CHG Soap.                9.  Pat yourself dry with a clean towel.            10.  Wear clean pajamas.            11.  Place clean sheets on your bed the night of your first shower and do not  sleep with pets. Day of Surgery : Do not apply any lotions/deodorants the morning of surgery.  Please wear clean clothes to the hospital/surgery center.  FAILURE TO FOLLOW THESE INSTRUCTIONS MAY RESULT IN THE CANCELLATION OF YOUR  SURGERY PATIENT SIGNATURE_________________________________  NURSE SIGNATURE__________________________________  ________________________________________________________________________   Danielle Villegas  An incentive spirometer is a tool that can help keep your lungs clear and active. This tool measures how well you are filling your lungs with each breath. Taking long deep breaths may help reverse or decrease the chance of developing breathing (pulmonary) problems (especially infection) following:  A long period of time when you are unable to move or be active. BEFORE THE PROCEDURE   If the spirometer includes an indicator to  show your best effort, your nurse or respiratory therapist will set it to a desired goal.  If possible, sit up straight or lean slightly forward. Try not to slouch.  Hold the incentive spirometer in an upright position. INSTRUCTIONS FOR USE  1. Sit on the edge of your bed if possible, or sit up as far as you can in bed or on a chair. 2. Hold the incentive spirometer in an upright position. 3. Breathe out normally. 4. Place the mouthpiece in your mouth and seal your lips tightly around it. 5. Breathe in slowly and as deeply as possible, raising the piston or the ball toward the top of the column. 6. Hold your breath for 3-5 seconds or for as long as possible. Allow the piston or ball to fall to the bottom of the column. 7. Remove the mouthpiece from your mouth and breathe out normally. 8. Rest for a few seconds and repeat Steps 1 through 7 at least 10 times every 1-2 hours when you are awake. Take your time and take a few normal breaths between deep breaths. 9. The spirometer may include an indicator to show your best effort. Use the indicator as a goal to work toward during each repetition. 10. After each set of 10 deep breaths, practice coughing to be sure your lungs are clear. If you have an incision (the cut made at the time of surgery), support your  incision when coughing by placing a pillow or rolled up towels firmly against it. Once you are able to get out of bed, walk around indoors and cough well. You may stop using the incentive spirometer when instructed by your caregiver.  RISKS AND COMPLICATIONS  Take your time so you do not get dizzy or light-headed.  If you are in pain, you may need to take or ask for pain medication before doing incentive spirometry. It is harder to take a deep breath if you are having pain. AFTER USE  Rest and breathe slowly and easily.  It can be helpful to keep track of a log of your progress. Your caregiver can provide you with a simple table to help with this. If you are using the spirometer at home, follow these instructions: [place] IF:   You are having difficultly using the spirometer.  You have trouble using the spirometer as often as instructed.  Your pain medication is not giving enough relief while using the spirometer.  You develop fever of 100.5 F (38.1 C) or higher. SEEK IMMEDIATE MEDICAL CARE IF:   You cough up bloody sputum that had not been present before.  You develop fever of 102 F (38.9 C) or greater.  You develop worsening pain at or near the incision site. MAKE SURE YOU:   Understand these instructions.  Will watch your condition.  Will get help right away if you are not doing well or get worse. Document Released: 01/27/2007 Document Revised: 12/09/2011 Document Reviewed: 03/30/2007 [facility] Patient Information 2014 [place], [place].   ________________________________________________________________________

## 2020-05-09 ENCOUNTER — Telehealth: Payer: Self-pay | Admitting: *Deleted

## 2020-05-09 ENCOUNTER — Other Ambulatory Visit (HOSPITAL_COMMUNITY)
Admission: RE | Admit: 2020-05-09 | Discharge: 2020-05-09 | Disposition: A | Discharge status: Home / Self Care | Payer: Medicare Other | Source: Ambulatory Visit | Attending: Orthopaedic Surgery | Admitting: Orthopaedic Surgery

## 2020-05-09 ENCOUNTER — Other Ambulatory Visit: Payer: Self-pay

## 2020-05-09 ENCOUNTER — Encounter (HOSPITAL_COMMUNITY): Payer: Self-pay

## 2020-05-09 ENCOUNTER — Encounter (HOSPITAL_COMMUNITY)
Admission: RE | Admit: 2020-05-09 | Discharge: 2020-05-09 | Disposition: A | Discharge status: Home / Self Care | Payer: Medicare Other | Source: Ambulatory Visit | Attending: Orthopaedic Surgery | Admitting: Orthopaedic Surgery

## 2020-05-09 DIAGNOSIS — Z86718 Personal history of other venous thrombosis and embolism: Secondary | ICD-10-CM | POA: Diagnosis not present

## 2020-05-09 DIAGNOSIS — I10 Essential (primary) hypertension: Secondary | ICD-10-CM | POA: Insufficient documentation

## 2020-05-09 DIAGNOSIS — Z01812 Encounter for preprocedural laboratory examination: Secondary | ICD-10-CM | POA: Insufficient documentation

## 2020-05-09 DIAGNOSIS — Z95828 Presence of other vascular implants and grafts: Secondary | ICD-10-CM | POA: Insufficient documentation

## 2020-05-09 DIAGNOSIS — I350 Nonrheumatic aortic (valve) stenosis: Secondary | ICD-10-CM | POA: Insufficient documentation

## 2020-05-09 DIAGNOSIS — Z7901 Long term (current) use of anticoagulants: Secondary | ICD-10-CM | POA: Insufficient documentation

## 2020-05-09 DIAGNOSIS — M329 Systemic lupus erythematosus, unspecified: Secondary | ICD-10-CM | POA: Insufficient documentation

## 2020-05-09 DIAGNOSIS — M1611 Unilateral primary osteoarthritis, right hip: Secondary | ICD-10-CM | POA: Insufficient documentation

## 2020-05-09 DIAGNOSIS — Z96652 Presence of left artificial knee joint: Secondary | ICD-10-CM | POA: Insufficient documentation

## 2020-05-09 DIAGNOSIS — D6851 Activated protein C resistance: Secondary | ICD-10-CM | POA: Insufficient documentation

## 2020-05-09 DIAGNOSIS — Z8673 Personal history of transient ischemic attack (TIA), and cerebral infarction without residual deficits: Secondary | ICD-10-CM | POA: Insufficient documentation

## 2020-05-09 DIAGNOSIS — Z20822 Contact with and (suspected) exposure to covid-19: Secondary | ICD-10-CM | POA: Diagnosis not present

## 2020-05-09 DIAGNOSIS — Z791 Long term (current) use of non-steroidal anti-inflammatories (NSAID): Secondary | ICD-10-CM | POA: Diagnosis not present

## 2020-05-09 DIAGNOSIS — Z7952 Long term (current) use of systemic steroids: Secondary | ICD-10-CM | POA: Insufficient documentation

## 2020-05-09 DIAGNOSIS — M069 Rheumatoid arthritis, unspecified: Secondary | ICD-10-CM | POA: Diagnosis not present

## 2020-05-09 DIAGNOSIS — Z79899 Other long term (current) drug therapy: Secondary | ICD-10-CM | POA: Insufficient documentation

## 2020-05-09 HISTORY — DX: Essential (primary) hypertension: I10

## 2020-05-09 HISTORY — DX: Cardiac murmur, unspecified: R01.1

## 2020-05-09 LAB — BASIC METABOLIC PANEL
Anion gap: 8 (ref 5–15)
BUN: 27 mg/dL — ABNORMAL HIGH (ref 8–23)
CO2: 26 mmol/L (ref 22–32)
Calcium: 9.4 mg/dL (ref 8.9–10.3)
Chloride: 108 mmol/L (ref 98–111)
Creatinine, Ser: 0.86 mg/dL (ref 0.44–1.00)
GFR calc Af Amer: >60 mL/min (ref >60)
GFR calc non Af Amer: >60 mL/min (ref >60)
Glucose, Bld: 105 mg/dL — ABNORMAL HIGH (ref 70–99)
Potassium: 3.7 mmol/L (ref 3.5–5.1)
Sodium: 142 mmol/L (ref 135–145)

## 2020-05-09 LAB — SURGICAL PCR SCREEN
MRSA, PCR: NEGATIVE
Staphylococcus aureus: POSITIVE — AB

## 2020-05-09 LAB — SARS CORONAVIRUS 2 (TAT 6-24 HRS): SARS Coronavirus 2: NEGATIVE

## 2020-05-09 NOTE — Progress Notes (Signed)
COVID Vaccine Completed:NO Date COVID Vaccine completed: COVID vaccine manufacturer: Pfizer    [employer]   PCP - Dr. Lona Kettle Cardiologist - Dr. Buford Dresser. LOV: 01/20/20  Chest x-ray -  EKG -  10/20/19 EPIC Stress Test -  ECHO - 11/01/19 EPIC Cardiac Cath -   Sleep Study -  CPAP -   Fasting Blood Sugar -  Checks Blood Sugar _____ times a day  Blood Thinner Instructions: Xarelto will be on hold starting on 8/11. As per cardiologist. Aspirin Instructions: Last Dose:  Anesthesia review: Hx: syncope,murmur,IVC filter,septic shock(with seizure episode and mini stroke). Pt. Is able to walk short distance due to her hip pain.Also she said she gets fatigue easily.  Patient denies shortness of breath, fever, cough and chest pain at PAT appointment   Patient verbalized understanding of instructions that were given to them at the PAT appointment. Patient was also instructed that they will need to review over the PAT instructions again at home before surgery.

## 2020-05-09 NOTE — Telephone Encounter (Signed)
Ortho bundle Pre-op call completed. 

## 2020-05-09 NOTE — Care Plan (Signed)
RNCM call to patient to discuss her upcoming Right THA with Dr. Ninfa Linden on Friday, 05/12/20. Patient is an Ortho bundle through [facility]. Discussed this and patient agreeable to case management. Patient states she lives with her daughter, who is unable to help her with any care after surgery. She states she lives in an apartment with 18 steps to get to the 2nd level. She has requested SNF for rehab after surgery briefly. CM will discuss with MD. Patient requested CM contact [facility] for "pre-arranging" of SNF placement for rehab. Has all DME needed at home already. No other DME needed. Will continue to follow for CM needs. Reviewed post-op instructions at length.

## 2020-05-10 NOTE — Anesthesia Preprocedure Evaluation (Addendum)
Anesthesia Evaluation  Patient identified by MRN, date of birth, ID band Patient awake    Reviewed: Allergy & Precautions, NPO status , Patient's Chart, lab work & pertinent test results  Airway Mallampati: II  TM Distance: >3 FB Neck ROM: Full    Dental  (+) Teeth Intact, Dental Advisory Given   Pulmonary neg pulmonary ROS,    Pulmonary exam normal breath sounds clear to auscultation       Cardiovascular hypertension, Pt. on medications (-) angina+ DVT  (-) CAD, (-) Past MI and (-) Cardiac Stents Normal cardiovascular exam Rhythm:Regular Rate:Normal     Neuro/Psych  Headaches, Seizures -, Well Controlled,  PSYCHIATRIC DISORDERS Anxiety Depression CVA    GI/Hepatic Neg liver ROS, GERD  Medicated,  Endo/Other  negative endocrine ROS  Renal/GU negative Renal ROS     Musculoskeletal  (+) Arthritis  (right hip osteoarthritis), Osteoarthritis and Rheumatoid disorders,  Fibromyalgia -  Abdominal   Peds  Hematology  (+) Blood dyscrasia (Xarelto), , Factor V Leiden   Anesthesia Other Findings Day of surgery medications reviewed with the patient.  Reproductive/Obstetrics                          Anesthesia Physical Anesthesia Plan  ASA: III  Anesthesia Plan: General   Post-op Pain Management:    Induction: Intravenous  PONV Risk Score and Plan: 3 and Midazolam, Dexamethasone and Ondansetron  Airway Management Planned: Oral ETT  Additional Equipment:   Intra-op Plan:   Post-operative Plan: Extubation in OR  Informed Consent: I have reviewed the patients History and Physical, chart, labs and discussed the procedure including the risks, benefits and alternatives for the proposed anesthesia with the patient or authorized representative who has indicated his/her understanding and acceptance.     Dental advisory given  Plan Discussed with: CRNA  Anesthesia Plan Comments: (See PAT note  05/09/2020, Konrad Felix, PA-C)       Anesthesia Quick Evaluation

## 2020-05-10 NOTE — Progress Notes (Signed)
Anesthesia Chart Review   Case: [id number] Date/Time: 05/12/20 1045   Procedure: RIGHT TOTAL HIP ARTHROPLASTY ANTERIOR APPROACH (Right Hip)   Anesthesia type: Choice   Pre-op diagnosis: right hip osteoarthritis   Location: [facility] ROOM 10 / [facility]   Surgeons: Mcarthur Rossetti, MD      DISCUSSION:64 y.o. never smoker with h/o Lupus, RA, Factor V Leiden, DVT 2013, mild aortic stenosis, right hip OA scheduled for above procedure 05/12/2020 with Dr. Jean Rosenthal.   DVT, Factor V Leiden with IVC filter in place Last seen by Hematology for preoperative evaluation.  Per OV note, " I spoke with Dr. Marin Olp and we will have her stop the aspirin and start Xarelto 10 mg PO daily today through Tuesday (05/09/2020) and restart Sunday (05/14/2020) for 6 weeks."  Pt seen by cardiology 01/20/2020.  Per OV note mild aortic stenosis, asymptomatic.  HTN well controlled.  2 year follow up recommended.    Anticipate pt can proceed with planned procedure barring acute status change.   VS: BP (!) 164/80 (BP Location: Right Arm)   Pulse 85   Temp 36.8 C (Oral)   Ht 5\' 7"  (1.702 m)   Wt 64.4 kg   SpO2 98%   BMI 22.24 kg/m   PROVIDERS: Lawerance Cruel, MD is PCP   Buford Dresser, MD is Cardiologist  LABS: Labs reviewed: Acceptable for surgery. (all labs ordered are listed, but only abnormal results are displayed)  Labs Reviewed  SURGICAL PCR SCREEN - Abnormal; Notable for the following components:      Result Value   Staphylococcus aureus POSITIVE (*)    All other components within normal limits  BASIC METABOLIC PANEL - Abnormal; Notable for the following components:   Glucose, Bld 105 (*)    BUN 27 (*)    All other components within normal limits  TYPE AND SCREEN     IMAGES:   EKG: 10/20/2019 Rate 80 bpm  NSR   CV: Echo 11/01/2019 IMPRESSIONS    1. The average left ventricular global longitudinal strain is -21.4 %.  2. Left ventricular ejection fraction, by visual  estimation, is 65 to  70%. The left ventricle has normal function. There is no left ventricular  hypertrophy.  3. The left ventricle has no regional wall motion abnormalities.  4. Global right ventricle has normal systolic function.The right  ventricular size is normal. No increase in right ventricular wall  thickness.  5. Left atrial size was normal.  6. Right atrial size was normal.  7. The mitral valve is normal in structure. Trivial mitral valve  regurgitation. No evidence of mitral stenosis.  8. The tricuspid valve is normal in structure. Tricuspid valve  regurgitation is mild.  9. The aortic valve is tricuspid. There is moderate thickening and  moderate calcification of the aortic valve. Aortic valve mean gradient  measures 16.0 mmHg. Aortic valve peak gradient measures 28.5 mmHg. Aortic  valve area, by VTI measures 1.04 cm.  Dimensionless index is 0.41. There is mild aortic stenosis and trivial  aortic insufficiency.  10. The pulmonic valve was normal in structure. Pulmonic valve  regurgitation is trivial.  11. Normal pulmonary artery systolic pressure.  12. The inferior vena cava is normal in size with greater than 50%  respiratory variability, suggesting right atrial pressure of 3 mmHg.  Past Medical History:  Diagnosis Date  . Anemia    hx of   . Anxiety   . Basal cell carcinoma    skin  .  DVT (deep venous thrombosis) ([place])    08/2012 in left leg   . Environmental allergies   . Factor 5 Leiden mutation, heterozygous ([place])   . Fibromyalgia   . Heart murmur   . Hypertension   . Lupus ([place])   . Pneumonia    hx of 22 years ago   . RA (rheumatoid arthritis) ([place])   . Seizure ([place])    1997  . Stroke ([place])    1997  . UTI (urinary tract infection)    hx of at age 2     Past Surgical History:  Procedure Laterality Date  . EYE SURGERY     cataract  . ivc filter     . KNEE ARTHROSCOPY     left  and to repair   . KNEE CLOSED REDUCTION Left 01/25/2013    Procedure: CLOSED MANIPULATION KNEE;  Surgeon: Mauri Pole, MD;  Location: [facility];  Service: Orthopedics;  Laterality: Left;  . TONSILLECTOMY AND ADENOIDECTOMY      MEDICATIONS: . ascorbic acid (VITAMIN C) 500 MG tablet  . Cetirizine HCl (ZYRTEC ALLERGY) 10 MG CAPS  . cholecalciferol (VITAMIN D) 1000 UNITS tablet  . famotidine (PEPCID) 20 MG tablet  . Fluticasone Propionate (FLONASE ALLERGY RELIEF NA)  . ibuprofen (ADVIL) 200 MG tablet  . irbesartan (AVAPRO) 150 MG tablet  . Lutein 20 MG TABS  . Magnesium 250 MG TABS  . meloxicam (MOBIC) 15 MG tablet  . Multiple Vitamin (MULTI-VITAMIN) tablet  . potassium gluconate 595 (99 K) MG TABS tablet  . predniSONE (DELTASONE) 1 MG tablet  . predniSONE (DELTASONE) 5 MG tablet  . Propylene Glycol (SYSTANE COMPLETE) 0.6 % SOLN  . rivaroxaban (XARELTO) 10 MG TABS tablet   No current facility-administered medications for this encounter.    Konrad Felix, PA-C [facility] Pre-Surgical Testing [phone]

## 2020-05-10 NOTE — Progress Notes (Signed)
PCR: Positive for STAPH. 

## 2020-05-11 NOTE — H&P (Signed)
TOTAL HIP ADMISSION H&P  Patient is admitted for right total hip arthroplasty.  Subjective:  Chief Complaint: right hip pain  HPI: Danielle Villegas, 64 y.o. female, has a history of pain and functional disability in the right hip(s) due to arthritis and patient has failed non-surgical conservative treatments for greater than 12 weeks to include NSAID's and/or analgesics, corticosteriod injections, flexibility and strengthening excercises, use of assistive devices and activity modification.  Onset of symptoms was gradual starting 3 years ago with gradually worsening course since that time.The patient noted no past surgery on the right hip(s).  Patient currently rates pain in the right hip at 10 out of 10 with activity. Patient has night pain, worsening of pain with activity and weight bearing, pain that interfers with activities of daily living and pain with passive range of motion. Patient has evidence of subchondral sclerosis, periarticular osteophytes and joint space narrowing by imaging studies. This condition presents safety issues increasing the risk of falls.  There is no current active infection.  Patient Active Problem List   Diagnosis Date Noted  . Unilateral primary osteoarthritis, right hip 04/26/2020  . Nonrheumatic aortic valve stenosis 01/21/2020  . Routine general medical examination at a health care facility 06/18/2013  . Post-op stiffness of left TKA 01/25/2013  . Autoimmune disease, not elsewhere classified(279.49) 01/10/2013  . Reactive depression (situational) 01/10/2013  . Elevated BP 01/10/2013  . Thrombocytopenia ([place]) 09/13/2012  . Anemia 09/13/2012  . Lower leg DVT (deep venous thromboembolism), acute ([place]) 09/12/2012  . Patellar dislocation 09/11/2012  . Fever 09/11/2012  . Memory loss 04/13/2012  . Generalized headaches 04/13/2012  . Otalgia 04/13/2012   Past Medical History:  Diagnosis Date  . Anemia    hx of   . Anxiety   . Basal cell carcinoma     skin  . DVT (deep venous thrombosis) ([place])    08/2012 in left leg   . Environmental allergies   . Factor 5 Leiden mutation, heterozygous ([place])   . Fibromyalgia   . Heart murmur   . Hypertension   . Lupus ([place])   . Pneumonia    hx of 22 years ago   . RA (rheumatoid arthritis) ([place])   . Seizure ([place])    1997  . Stroke ([place])    1997  . UTI (urinary tract infection)    hx of at age 38     Past Surgical History:  Procedure Laterality Date  . EYE SURGERY     cataract  . ivc filter     . KNEE ARTHROSCOPY     left  and to repair   . KNEE CLOSED REDUCTION Left 01/25/2013   Procedure: CLOSED MANIPULATION KNEE;  Surgeon: Mauri Pole, MD;  Location: [facility];  Service: Orthopedics;  Laterality: Left;  . TONSILLECTOMY AND ADENOIDECTOMY      No current facility-administered medications for this encounter.   Current Outpatient Medications  Medication Sig Dispense Refill Last Dose  . ascorbic acid (VITAMIN C) 500 MG tablet Take 500 mg by mouth daily.     . Cetirizine HCl (ZYRTEC ALLERGY) 10 MG CAPS Take 10 mg by mouth daily.      . cholecalciferol (VITAMIN D) 1000 UNITS tablet Take 1,000 Units by mouth daily.     . famotidine (PEPCID) 20 MG tablet Take 20 mg by mouth daily as needed for heartburn or indigestion.     . Fluticasone Propionate (FLONASE ALLERGY RELIEF NA) Place 1 spray into both nostrils daily as  needed (allergies).      Marland Kitchen ibuprofen (ADVIL) 200 MG tablet Take 400 mg by mouth every 6 (six) hours as needed for headache.     . irbesartan (AVAPRO) 150 MG tablet Take 150 mg by mouth daily.     . Lutein 20 MG TABS Take 20 mg by mouth daily.     . Magnesium 250 MG TABS Take 250 mg by mouth 2 (two) times a week. On Sun and Thurs     . meloxicam (MOBIC) 15 MG tablet Take 1 tablet (15 mg total) by mouth daily as needed for pain. 30 tablet 1   . Multiple Vitamin (MULTI-VITAMIN) tablet Take 1 tablet by mouth daily.      . potassium gluconate 595 (99 K) MG TABS tablet Take 595 mg by  mouth 2 (two) times a week. On Tues and Sat     . predniSONE (DELTASONE) 1 MG tablet TAKE 2 TABLETS BY MOUTH EVERY DAY (TAKE WITH 5MG  EQUAL TO 7MG ) (Patient taking differently: Take 1 mg by mouth daily. Take with the 5 mg to equal 6 mg daily) 60 tablet 0   . predniSONE (DELTASONE) 5 MG tablet TAKE 1 TABLET BY MOUTH EVERY MORNING -TAKE WITH TWO 1MG  TABLETS TO EQUAL DOSE OF 7MG  (Patient taking differently: Take 5 mg by mouth daily. Take with the 1 mg tablet to equal 6 mg daily) 30 tablet 0   . Propylene Glycol (SYSTANE COMPLETE) 0.6 % SOLN Place 1 drop into both eyes daily as needed (dry eyes).     . rivaroxaban (XARELTO) 10 MG TABS tablet Take 1 tablet (10 mg total) by mouth daily. 30 tablet 1    Allergies  Allergen Reactions  . Methotrexate Other (See Comments)    Entire body started to shut down, on life support for 2.5 weeks  . Dilantin [Phenytoin Sodium Extended] Other (See Comments)    Dizziness  . Sulfa Antibiotics Other (See Comments)    Body Stiffness    Social History   Tobacco Use  . Smoking status: Never Smoker  . Smokeless tobacco: Never Used  Substance Use Topics  . Alcohol use: Not Currently    Comment: Rarely    Family History  Problem Relation Age of Onset  . Alcohol abuse Other   . Arthritis Other   . Breast cancer Other   . Prostate cancer Father   . Stroke Other   . Hypertension Other   . Heart disease Other      Review of Systems  All other systems reviewed and are negative.   Objective:  Physical Exam Vitals reviewed.  Constitutional:      Appearance: Normal appearance.  HENT:     Head: Normocephalic and atraumatic.  Eyes:     Extraocular Movements: Extraocular movements intact.     Pupils: Pupils are equal, round, and reactive to light.  Cardiovascular:     Rate and Rhythm: Normal rate and regular rhythm.  Pulmonary:     Effort: Pulmonary effort is normal.     Breath sounds: Normal breath sounds.  Abdominal:     Palpations: Abdomen is soft.   Musculoskeletal:     Cervical back: Normal range of motion and neck supple.     Right hip: Tenderness and bony tenderness present. Decreased range of motion. Decreased strength.  Neurological:     Mental Status: She is alert and oriented to person, place, and time.  Psychiatric:        Behavior: Behavior normal.  Vital signs in last 24 hours:    Labs:   Estimated body mass index is 22.24 kg/m as calculated from the following:   Height as of 05/09/20: 5\' 7"  (1.702 m).   Weight as of 05/09/20: 64.4 kg.   Imaging Review Plain radiographs demonstrate severe degenerative joint disease of the right hip(s). The bone quality appears to be good for age and reported activity level.      Assessment/Plan:  End stage arthritis, right hip(s)  The patient history, physical examination, clinical judgement of the provider and imaging studies are consistent with end stage degenerative joint disease of the right hip(s) and total hip arthroplasty is deemed medically necessary. The treatment options including medical management, injection therapy, arthroscopy and arthroplasty were discussed at length. The risks and benefits of total hip arthroplasty were presented and reviewed. The risks due to aseptic loosening, infection, stiffness, dislocation/subluxation,  thromboembolic complications and other imponderables were discussed.  The patient acknowledged the explanation, agreed to proceed with the plan and consent was signed. Patient is being admitted for inpatient treatment for surgery, pain control, PT, OT, prophylactic antibiotics, VTE prophylaxis, progressive ambulation and ADL's and discharge planning.The patient is planning to be discharged home with home health services

## 2020-05-12 ENCOUNTER — Encounter (HOSPITAL_COMMUNITY)
Admission: RE | Disposition: A | Discharge status: Skilled Nursing Facility | Payer: Self-pay | Source: Home / Self Care | Attending: Orthopaedic Surgery

## 2020-05-12 ENCOUNTER — Encounter (HOSPITAL_COMMUNITY): Payer: Self-pay | Admitting: Orthopaedic Surgery

## 2020-05-12 ENCOUNTER — Ambulatory Visit (HOSPITAL_COMMUNITY): Payer: Medicare Other | Admitting: Physician Assistant

## 2020-05-12 ENCOUNTER — Other Ambulatory Visit: Payer: Self-pay

## 2020-05-12 ENCOUNTER — Observation Stay (HOSPITAL_COMMUNITY): Payer: Medicare Other

## 2020-05-12 ENCOUNTER — Ambulatory Visit (HOSPITAL_COMMUNITY): Payer: Medicare Other

## 2020-05-12 ENCOUNTER — Inpatient Hospital Stay (HOSPITAL_COMMUNITY)
Admission: RE | Admit: 2020-05-12 | Discharge: 2020-05-15 | DRG: 470 | Disposition: A | Discharge status: Skilled Nursing Facility | Payer: Medicare Other | Attending: Orthopaedic Surgery | Admitting: Orthopaedic Surgery

## 2020-05-12 ENCOUNTER — Ambulatory Visit (HOSPITAL_COMMUNITY): Payer: Medicare Other | Admitting: Anesthesiology

## 2020-05-12 DIAGNOSIS — M069 Rheumatoid arthritis, unspecified: Secondary | ICD-10-CM | POA: Diagnosis present

## 2020-05-12 DIAGNOSIS — M329 Systemic lupus erythematosus, unspecified: Secondary | ICD-10-CM | POA: Diagnosis present

## 2020-05-12 DIAGNOSIS — Z86718 Personal history of other venous thrombosis and embolism: Secondary | ICD-10-CM

## 2020-05-12 DIAGNOSIS — I10 Essential (primary) hypertension: Secondary | ICD-10-CM | POA: Diagnosis present

## 2020-05-12 DIAGNOSIS — Z8673 Personal history of transient ischemic attack (TIA), and cerebral infarction without residual deficits: Secondary | ICD-10-CM | POA: Diagnosis not present

## 2020-05-12 DIAGNOSIS — M797 Fibromyalgia: Secondary | ICD-10-CM | POA: Diagnosis present

## 2020-05-12 DIAGNOSIS — Z7901 Long term (current) use of anticoagulants: Secondary | ICD-10-CM | POA: Diagnosis not present

## 2020-05-12 DIAGNOSIS — Z79899 Other long term (current) drug therapy: Secondary | ICD-10-CM | POA: Diagnosis not present

## 2020-05-12 DIAGNOSIS — Z20822 Contact with and (suspected) exposure to covid-19: Secondary | ICD-10-CM | POA: Diagnosis present

## 2020-05-12 DIAGNOSIS — Z7952 Long term (current) use of systemic steroids: Secondary | ICD-10-CM | POA: Diagnosis not present

## 2020-05-12 DIAGNOSIS — Z96652 Presence of left artificial knee joint: Secondary | ICD-10-CM | POA: Diagnosis present

## 2020-05-12 DIAGNOSIS — Z471 Aftercare following joint replacement surgery: Secondary | ICD-10-CM | POA: Diagnosis not present

## 2020-05-12 DIAGNOSIS — Z96641 Presence of right artificial hip joint: Secondary | ICD-10-CM

## 2020-05-12 DIAGNOSIS — M1611 Unilateral primary osteoarthritis, right hip: Secondary | ICD-10-CM | POA: Diagnosis not present

## 2020-05-12 DIAGNOSIS — K219 Gastro-esophageal reflux disease without esophagitis: Secondary | ICD-10-CM | POA: Diagnosis not present

## 2020-05-12 DIAGNOSIS — Z85828 Personal history of other malignant neoplasm of skin: Secondary | ICD-10-CM

## 2020-05-12 DIAGNOSIS — F418 Other specified anxiety disorders: Secondary | ICD-10-CM | POA: Diagnosis not present

## 2020-05-12 DIAGNOSIS — D6851 Activated protein C resistance: Secondary | ICD-10-CM | POA: Diagnosis present

## 2020-05-12 DIAGNOSIS — Z419 Encounter for procedure for purposes other than remedying health state, unspecified: Secondary | ICD-10-CM

## 2020-05-12 DIAGNOSIS — F419 Anxiety disorder, unspecified: Secondary | ICD-10-CM | POA: Diagnosis present

## 2020-05-12 HISTORY — PX: TOTAL HIP ARTHROPLASTY: SHX124

## 2020-05-12 LAB — TYPE AND SCREEN
ABO/RH(D): A POS
Antibody Screen: NEGATIVE

## 2020-05-12 SURGERY — ARTHROPLASTY, HIP, TOTAL, ANTERIOR APPROACH
Anesthesia: General | Site: Hip | Laterality: Right

## 2020-05-12 MED ORDER — MAGNESIUM 250 MG PO TABS: 250.0000 mg | ORAL_TABLET | ORAL | Status: DC

## 2020-05-12 MED ORDER — TRANEXAMIC ACID-NACL 1000-0.7 MG/100ML-% IV SOLN
1000.0000 mg | INTRAVENOUS | Status: AC
  Administered 2020-05-12: 1000 mg via INTRAVENOUS
  Filled 2020-05-12: qty 100

## 2020-05-12 MED ORDER — ADULT MULTIVITAMIN W/MINERALS CH
1.0000 | ORAL_TABLET | Freq: Every day | ORAL | Status: DC
  Administered 2020-05-13 – 2020-05-15 (×2): 1 via ORAL
  Filled 2020-05-12 (×3): qty 1

## 2020-05-12 MED ORDER — FENTANYL CITRATE (PF) 250 MCG/5ML IJ SOLN
INTRAMUSCULAR | Status: AC
  Filled 2020-05-12: qty 5

## 2020-05-12 MED ORDER — ACETAMINOPHEN 500 MG PO TABS
1000.0000 mg | ORAL_TABLET | Freq: Once | ORAL | Status: AC
  Administered 2020-05-12: 1000 mg via ORAL
  Filled 2020-05-12: qty 2

## 2020-05-12 MED ORDER — RIVAROXABAN 10 MG PO TABS
10.0000 mg | ORAL_TABLET | Freq: Every day | ORAL | Status: DC
  Administered 2020-05-14 – 2020-05-15 (×2): 10 mg via ORAL
  Filled 2020-05-12 (×3): qty 1

## 2020-05-12 MED ORDER — SUGAMMADEX SODIUM 200 MG/2ML IV SOLN
INTRAVENOUS | Status: DC | PRN
  Administered 2020-05-12: 128.8 mg via INTRAVENOUS

## 2020-05-12 MED ORDER — FENTANYL CITRATE (PF) 100 MCG/2ML IJ SOLN
INTRAMUSCULAR | Status: AC
  Filled 2020-05-12: qty 4

## 2020-05-12 MED ORDER — POLYVINYL ALCOHOL 1.4 % OP SOLN: 1.0000 [drp] | OPHTHALMIC | Status: DC | PRN

## 2020-05-12 MED ORDER — CHLORHEXIDINE GLUCONATE 0.12 % MT SOLN
15.0000 mL | Freq: Once | OROMUCOSAL | Status: AC
  Administered 2020-05-12: 15 mL via OROMUCOSAL

## 2020-05-12 MED ORDER — 0.9 % SODIUM CHLORIDE (POUR BTL) OPTIME
TOPICAL | Status: DC | PRN
  Administered 2020-05-12: 1000 mL

## 2020-05-12 MED ORDER — FAMOTIDINE 20 MG PO TABS: 20.0000 mg | ORAL_TABLET | Freq: Every day | ORAL | Status: DC | PRN

## 2020-05-12 MED ORDER — PROPOFOL 10 MG/ML IV BOLUS
INTRAVENOUS | Status: DC | PRN
  Administered 2020-05-12: 100 mg via INTRAVENOUS

## 2020-05-12 MED ORDER — PROMETHAZINE HCL 25 MG/ML IJ SOLN: 6.2500 mg | INTRAMUSCULAR | Status: DC | PRN

## 2020-05-12 MED ORDER — CEFAZOLIN SODIUM-DEXTROSE 1-4 GM/50ML-% IV SOLN
1.0000 g | Freq: Four times a day (QID) | INTRAVENOUS | Status: AC
  Administered 2020-05-12 (×2): 1 g via INTRAVENOUS
  Filled 2020-05-12: qty 50

## 2020-05-12 MED ORDER — DOCUSATE SODIUM 100 MG PO CAPS
100.0000 mg | ORAL_CAPSULE | Freq: Two times a day (BID) | ORAL | Status: DC
  Administered 2020-05-12 – 2020-05-15 (×4): 100 mg via ORAL
  Filled 2020-05-12 (×5): qty 1

## 2020-05-12 MED ORDER — VITAMIN D 25 MCG (1000 UNIT) PO TABS
1000.0000 [IU] | ORAL_TABLET | Freq: Every day | ORAL | Status: DC
  Administered 2020-05-13 – 2020-05-15 (×3): 1000 [IU] via ORAL
  Filled 2020-05-12 (×2): qty 1

## 2020-05-12 MED ORDER — METHOCARBAMOL 500 MG PO TABS
500.0000 mg | ORAL_TABLET | Freq: Four times a day (QID) | ORAL | Status: DC | PRN
  Administered 2020-05-12 – 2020-05-15 (×8): 500 mg via ORAL
  Filled 2020-05-12 (×8): qty 1

## 2020-05-12 MED ORDER — METHOCARBAMOL 500 MG IVPB - SIMPLE MED
500.0000 mg | Freq: Four times a day (QID) | INTRAVENOUS | Status: DC | PRN
  Administered 2020-05-12: 500 mg via INTRAVENOUS
  Filled 2020-05-12 (×2): qty 50
  Filled 2020-05-12: qty 500

## 2020-05-12 MED ORDER — SODIUM CHLORIDE 0.9 % IR SOLN
Status: DC | PRN
  Administered 2020-05-12: 1000 mL

## 2020-05-12 MED ORDER — POVIDONE-IODINE 10 % EX SWAB
2.0000 "application " | Freq: Once | CUTANEOUS | Status: AC
  Administered 2020-05-12: 2 via TOPICAL

## 2020-05-12 MED ORDER — ONDANSETRON HCL 4 MG/2ML IJ SOLN
INTRAMUSCULAR | Status: DC | PRN
  Administered 2020-05-12: 4 mg via INTRAVENOUS

## 2020-05-12 MED ORDER — ACETAMINOPHEN 325 MG PO TABS
325.0000 mg | ORAL_TABLET | Freq: Four times a day (QID) | ORAL | Status: DC | PRN
  Administered 2020-05-13: 650 mg via ORAL
  Filled 2020-05-12: qty 2

## 2020-05-12 MED ORDER — CEFAZOLIN SODIUM-DEXTROSE 2-4 GM/100ML-% IV SOLN
2.0000 g | INTRAVENOUS | Status: AC
  Administered 2020-05-12: 2 g via INTRAVENOUS
  Filled 2020-05-12: qty 100

## 2020-05-12 MED ORDER — PROPOFOL 10 MG/ML IV BOLUS
INTRAVENOUS | Status: AC
  Filled 2020-05-12: qty 20

## 2020-05-12 MED ORDER — PREDNISONE 5 MG PO TABS
5.0000 mg | ORAL_TABLET | Freq: Every day | ORAL | Status: DC
  Administered 2020-05-13 – 2020-05-15 (×3): 5 mg via ORAL
  Filled 2020-05-12 (×3): qty 1

## 2020-05-12 MED ORDER — METOCLOPRAMIDE HCL 5 MG/ML IJ SOLN: 5.0000 mg | Freq: Three times a day (TID) | INTRAMUSCULAR | Status: DC | PRN

## 2020-05-12 MED ORDER — METOCLOPRAMIDE HCL 5 MG PO TABS: 5.0000 mg | ORAL_TABLET | Freq: Three times a day (TID) | ORAL | Status: DC | PRN

## 2020-05-12 MED ORDER — LUTEIN 20 MG PO TABS: 20.0000 mg | ORAL_TABLET | Freq: Every day | ORAL | Status: DC

## 2020-05-12 MED ORDER — FENTANYL CITRATE (PF) 100 MCG/2ML IJ SOLN
25.0000 ug | INTRAMUSCULAR | Status: DC | PRN
  Administered 2020-05-12 (×3): 50 ug via INTRAVENOUS

## 2020-05-12 MED ORDER — DIPHENHYDRAMINE HCL 12.5 MG/5ML PO ELIX: 12.5000 mg | ORAL_SOLUTION | ORAL | Status: DC | PRN

## 2020-05-12 MED ORDER — ROCURONIUM BROMIDE 100 MG/10ML IV SOLN
INTRAVENOUS | Status: DC | PRN
Start: 2020-05-12 — End: 2020-05-12
  Administered 2020-05-12: 50 mg via INTRAVENOUS

## 2020-05-12 MED ORDER — HYDROMORPHONE HCL 1 MG/ML IJ SOLN: 0.5000 mg | INTRAMUSCULAR | Status: DC | PRN

## 2020-05-12 MED ORDER — SODIUM CHLORIDE 0.9 % IV SOLN: INTRAVENOUS | Status: DC

## 2020-05-12 MED ORDER — LACTATED RINGERS IV SOLN: INTRAVENOUS | Status: DC

## 2020-05-12 MED ORDER — LIDOCAINE HCL (CARDIAC) PF 100 MG/5ML IV SOSY
PREFILLED_SYRINGE | INTRAVENOUS | Status: DC | PRN
  Administered 2020-05-12: 60 mg via INTRATRACHEAL

## 2020-05-12 MED ORDER — ORAL CARE MOUTH RINSE: 15.0000 mL | Freq: Once | OROMUCOSAL | Status: AC

## 2020-05-12 MED ORDER — ALUM & MAG HYDROXIDE-SIMETH 200-200-20 MG/5ML PO SUSP: 30.0000 mL | ORAL | Status: DC | PRN

## 2020-05-12 MED ORDER — IRBESARTAN 150 MG PO TABS
150.0000 mg | ORAL_TABLET | Freq: Every day | ORAL | Status: DC
  Administered 2020-05-12 – 2020-05-14 (×3): 150 mg via ORAL
  Filled 2020-05-12 (×4): qty 1

## 2020-05-12 MED ORDER — ONDANSETRON HCL 4 MG/2ML IJ SOLN
4.0000 mg | Freq: Four times a day (QID) | INTRAMUSCULAR | Status: DC | PRN
  Administered 2020-05-12 – 2020-05-15 (×3): 4 mg via INTRAVENOUS
  Filled 2020-05-12 (×3): qty 2

## 2020-05-12 MED ORDER — POLYETHYLENE GLYCOL 3350 17 G PO PACK
17.0000 g | PACK | Freq: Every day | ORAL | Status: DC | PRN
  Administered 2020-05-14: 17 g via ORAL
  Filled 2020-05-12: qty 1

## 2020-05-12 MED ORDER — POTASSIUM GLUCONATE 595 (99 K) MG PO TABS: 595.0000 mg | ORAL_TABLET | ORAL | Status: DC

## 2020-05-12 MED ORDER — PREDNISONE 1 MG PO TABS
1.0000 mg | ORAL_TABLET | Freq: Every day | ORAL | Status: DC
  Administered 2020-05-13 – 2020-05-15 (×3): 1 mg via ORAL
  Filled 2020-05-12 (×3): qty 1

## 2020-05-12 MED ORDER — PHENYLEPHRINE 40 MCG/ML (10ML) SYRINGE FOR IV PUSH (FOR BLOOD PRESSURE SUPPORT)
PREFILLED_SYRINGE | INTRAVENOUS | Status: DC | PRN
  Administered 2020-05-12: 100 ug via INTRAVENOUS

## 2020-05-12 MED ORDER — MIDAZOLAM HCL 2 MG/2ML IJ SOLN
INTRAMUSCULAR | Status: AC
  Filled 2020-05-12: qty 2

## 2020-05-12 MED ORDER — GABAPENTIN 100 MG PO CAPS
100.0000 mg | ORAL_CAPSULE | Freq: Three times a day (TID) | ORAL | Status: DC
  Administered 2020-05-12 – 2020-05-13 (×3): 100 mg via ORAL
  Filled 2020-05-12 (×3): qty 1

## 2020-05-12 MED ORDER — OXYCODONE HCL 5 MG PO TABS
5.0000 mg | ORAL_TABLET | ORAL | Status: DC | PRN
  Administered 2020-05-12 (×2): 10 mg via ORAL
  Filled 2020-05-12: qty 2
  Filled 2020-05-12: qty 1
  Filled 2020-05-12: qty 2

## 2020-05-12 MED ORDER — MENTHOL 3 MG MT LOZG: 1.0000 | LOZENGE | OROMUCOSAL | Status: DC | PRN

## 2020-05-12 MED ORDER — STERILE WATER FOR IRRIGATION IR SOLN
Status: DC | PRN
  Administered 2020-05-12: 1000 mL

## 2020-05-12 MED ORDER — PHENOL 1.4 % MT LIQD: 1.0000 | OROMUCOSAL | Status: DC | PRN

## 2020-05-12 MED ORDER — OXYCODONE HCL 5 MG PO TABS: 10.0000 mg | ORAL_TABLET | ORAL | Status: DC | PRN

## 2020-05-12 MED ORDER — PROPYLENE GLYCOL 0.6 % OP SOLN: 1.0000 [drp] | Freq: Every day | OPHTHALMIC | Status: DC | PRN

## 2020-05-12 MED ORDER — DEXAMETHASONE SODIUM PHOSPHATE 10 MG/ML IJ SOLN
INTRAMUSCULAR | Status: DC | PRN
  Administered 2020-05-12: 8 mg via INTRAVENOUS

## 2020-05-12 MED ORDER — FENTANYL CITRATE (PF) 250 MCG/5ML IJ SOLN
INTRAMUSCULAR | Status: DC | PRN
  Administered 2020-05-12: 50 ug via INTRAVENOUS
  Administered 2020-05-12 (×3): 25 ug via INTRAVENOUS
  Administered 2020-05-12: 75 ug via INTRAVENOUS

## 2020-05-12 MED ORDER — MIDAZOLAM HCL 5 MG/5ML IJ SOLN
INTRAMUSCULAR | Status: DC | PRN
  Administered 2020-05-12: 2 mg via INTRAVENOUS

## 2020-05-12 MED ORDER — ASCORBIC ACID 500 MG PO TABS
500.0000 mg | ORAL_TABLET | Freq: Every day | ORAL | Status: DC
  Administered 2020-05-13 – 2020-05-15 (×3): 500 mg via ORAL
  Filled 2020-05-12 (×3): qty 1

## 2020-05-12 MED ORDER — ONDANSETRON HCL 4 MG PO TABS: 4.0000 mg | ORAL_TABLET | Freq: Four times a day (QID) | ORAL | Status: DC | PRN

## 2020-05-12 SURGICAL SUPPLY — 37 items
BAG ZIPLOCK 12X15 (MISCELLANEOUS) IMPLANT
BENZOIN TINCTURE PRP APPL 2/3 (GAUZE/BANDAGES/DRESSINGS) IMPLANT
BLADE SAW SGTL 18X1.27X75 (BLADE) ×2 IMPLANT
COVER PERINEAL POST (MISCELLANEOUS) ×2 IMPLANT
COVER SURGICAL LIGHT HANDLE (MISCELLANEOUS) ×2 IMPLANT
COVER WAND RF STERILE (DRAPES) ×2 IMPLANT
CUP SECTOR GRIPTON 50MM (Cup) ×2 IMPLANT
DRAPE STERI IOBAN 125X83 (DRAPES) ×2 IMPLANT
DRAPE U-SHAPE 47X51 STRL (DRAPES) ×4 IMPLANT
DRSG AQUACEL AG ADV 3.5X10 (GAUZE/BANDAGES/DRESSINGS) ×2 IMPLANT
DURAPREP 26ML APPLICATOR (WOUND CARE) ×2 IMPLANT
ELECT REM PT RETURN 15FT ADLT (MISCELLANEOUS) ×2 IMPLANT
GAUZE XEROFORM 1X8 LF (GAUZE/BANDAGES/DRESSINGS) ×2 IMPLANT
GLOVE BIO SURGEON STRL SZ7.5 (GLOVE) ×2 IMPLANT
GLOVE BIOGEL PI IND STRL 8 (GLOVE) ×2 IMPLANT
GLOVE BIOGEL PI INDICATOR 8 (GLOVE) ×2
GLOVE ECLIPSE 8.0 STRL XLNG CF (GLOVE) ×2 IMPLANT
GOWN STRL REUS W/TWL XL LVL3 (GOWN DISPOSABLE) ×4 IMPLANT
HANDPIECE INTERPULSE COAX TIP (DISPOSABLE) ×2
HEAD FEMORAL 32 CERAMIC (Hips) ×2 IMPLANT
HOLDER FOLEY CATH W/STRAP (MISCELLANEOUS) ×2 IMPLANT
KIT TURNOVER KIT A (KITS) IMPLANT
LINER ACETABULAR 32X50 (Liner) ×2 IMPLANT
PACK ANTERIOR HIP CUSTOM (KITS) ×2 IMPLANT
PENCIL SMOKE EVACUATOR (MISCELLANEOUS) IMPLANT
SET HNDPC FAN SPRY TIP SCT (DISPOSABLE) ×1 IMPLANT
STAPLER VISISTAT 35W (STAPLE) IMPLANT
STEM CORAIL KA11 (Stem) ×2 IMPLANT
STRIP CLOSURE SKIN 1/2X4 (GAUZE/BANDAGES/DRESSINGS) IMPLANT
SUT ETHIBOND NAB CT1 #1 30IN (SUTURE) ×2 IMPLANT
SUT ETHILON 2 0 PS N (SUTURE) IMPLANT
SUT MNCRL AB 4-0 PS2 18 (SUTURE) IMPLANT
SUT VIC AB 0 CT1 36 (SUTURE) ×2 IMPLANT
SUT VIC AB 1 CT1 36 (SUTURE) ×2 IMPLANT
SUT VIC AB 2-0 CT1 27 (SUTURE) ×2
SUT VIC AB 2-0 CT1 TAPERPNT 27 (SUTURE) ×2 IMPLANT
TRAY FOLEY MTR SLVR 16FR STAT (SET/KITS/TRAYS/PACK) IMPLANT

## 2020-05-12 NOTE — Interval H&P Note (Signed)
History and Physical Interval Note: The patient understands that she is here today for right total hip arthroplasty to treat the pain from her osteoarthritis in the right hip.  There is been no acute change in medical status.  See recent H&P.  The risk and benefits of surgery been explained in detail and informed consent is obtained.  The right hip is been marked.  05/12/2020 8:30 AM  Danielle Villegas  has presented today for surgery, with the diagnosis of right hip osteoarthritis.  The various methods of treatment have been discussed with the patient and family. After consideration of risks, benefits and other options for treatment, the patient has consented to  Procedure(s): RIGHT TOTAL HIP ARTHROPLASTY ANTERIOR APPROACH (Right) as a surgical intervention.  The patient's history has been reviewed, patient examined, no change in status, stable for surgery.  I have reviewed the patient's chart and labs.  Questions were answered to the patient's satisfaction.     Mcarthur Rossetti

## 2020-05-12 NOTE — Progress Notes (Signed)
Orthopedic Tech Progress Note Patient Details:  Danielle Villegas 11/25/1955 [id number]  Ortho Devices Ortho Device/Splint Location: applied overhead frame Ortho Device/Splint Interventions: Application, Ordered   Post Interventions Patient Tolerated: Well Instructions Provided: Care of device   Braulio Bosch 05/12/2020, 2:41 PM

## 2020-05-12 NOTE — Op Note (Signed)
NAME: Danielle Villegas, Danielle Villegas MEDICAL RECORD [id number] ACCOUNT [id number] DATE OF BIRTH:[birth year] FACILITY: [facility] LOCATION: [facility] PHYSICIAN:Nafeesah Lapaglia Kerry Fort, MD  OPERATIVE REPORT  DATE OF PROCEDURE:  05/12/2020  PREOPERATIVE DIAGNOSIS:  Primary osteoarthritis and degenerative joint disease, right hip.  POSTOPERATIVE DIAGNOSIS:  Primary osteoarthritis and degenerative joint disease, right hip.  PROCEDURE:  Right total hip arthroplasty through direct anterior approach.  IMPLANTS:  DePuy Sector Gription acetabular component size 50, size 32+0 neutral polyethylene liner, size 11 Corail femoral component with standard offset, size 32+1 ceramic hip ball.  SURGEON:  Lind Guest. Ninfa Linden, MD  ASSISTANT:  Erskine Emery, PA-C  ANESTHESIA:  General.  ANTIBIOTICS:  Two g IV Ancef.  ESTIMATED BLOOD LOSS:  100 mL.  COMPLICATIONS:  None.  INDICATIONS:  The patient is a 64 year old female with debilitating arthritis involving her right hip.  She has tried and failed all forms of conservative treatment.  At this point, she wished to proceed with a total hip arthroplasty.  Her pain is daily  and is detrimentally affecting her mobility, her quality of life and activities of daily living  with surgery.  She understands there is the risk of acute blood loss anemia, nerve or vessel injury, fracture, infection, dislocation, DVT and implant  failure.  Her blood loss risk is certainly higher due to factor deficiency.  She understands our goals are to decrease pain, improve mobility and overall improve quality of life.  DESCRIPTION OF PROCEDURE:  After informed consent was obtained and appropriate right hip was marked, she was brought to the operating room where general anesthesia was obtained while she was on a stretcher.  We then placed traction boots on both her feet  and she was placed supine on the Hana fracture table, the perineal post in place and both legs in line skeletal traction  device and no traction applied.  Her right operative hip was prepped and draped with DuraPrep and sterile drapes.  A time-out was  called to identify correct patient, correct right hip.  I then made an incision just inferior and posterior to the anterior defect spine and carried this obliquely down the leg.  We dissected down to the tensor fascia lata muscle.  Tensor fascia was then  divided longitudinally to proceed with direct anterior approach to the hip.  We identified and cauterized circumflex vessels and identified the hip capsule, opened up the hip capsule in an L-type format finding a moderate joint effusion and significant  arthritis around the right hip.  I placed the Cobra retractors within the joint capsule around the medial and lateral femoral neck and made our femoral neck cut with an oscillating saw just proximal to the lesser trochanter and completed this with an  osteotome.  I placed a corkscrew guide in the femoral head and removed the femoral head in its entirety and found a wide area devoid of cartilage.  I then placed a bent Hohmann over the medial acetabular rim and removed remnants of the acetabular labrum  and other debris.  We then began reaming under direct visualization from a size 44 reamer in stepwise increments up to a size 49.  With all reamers under direct visualization, the last reamer was also placed under direct fluoroscopy so I could obtain my  depth of reaming, our inclination and anteversion.  I then placed the real DePuy Sector Gription acetabular component size 50 and a 32+0 neutral polyethylene liner based on her offset.  Attention was then turned to the femur.  With the  leg externally  rotated to 120 degrees, extended and adducted, we were able to place a Mueller retractor medially and a Hohman retractor behind the greater trochanter. We released the lateral joint capsule and used a box-cutting osteotome to enter the femoral canal and  a rongeur to lateralize.  We  then began broaching using the Corail broaching system from a size 8 going up to a size 11.  With a size 11 in place, we trialed a standard offset femoral neck and a 32+1 hip ball.  We reduced this in the acetabulum and we  were pleased with the leg length, offset, range of motion and stability.  She may be just a touch longer, but she is stable.  We had no ability to go shorter.  We then dislocated the hip and removed the trial components.  I placed the real Corail femoral  component size 11 with standard offset and the real 32+1 ceramic hip ball, again reduced this in the acetabulum and it was stable.  We then irrigated the soft tissue with normal saline solution using pulsatile lavage.  We closed the joint capsule with  interrupted #1 Ethibond suture, followed by closing the tensor fascia with #1 Vicryl.  Zero Vicryl was used to close deep tissue and 2-0 Vicryl was used to close subcutaneous tissue.  Due to bleeding risk, we did close the skin with staples.  Xeroform  and Aquacel dressing was applied.  She was taken off the Hana table, awakened, extubated, and taken to the recovery room in stable condition.  All final counts were correct.  There were no complications noted.  Of note, Benita Stabile, PA-C, assisted during  the entire case.  His assistance was crucial for facilitating all aspects of this case.  VN/NUANCE  D:05/12/2020 T:05/12/2020 JOB:[id number]

## 2020-05-12 NOTE — Brief Op Note (Signed)
05/12/2020  12:02 PM  PATIENT:  Danielle Villegas  64 y.o. female  PRE-OPERATIVE DIAGNOSIS:  right hip osteoarthritis  POST-OPERATIVE DIAGNOSIS:  right hip osteoarthritis  PROCEDURE:  Procedure(s): RIGHT TOTAL HIP ARTHROPLASTY ANTERIOR APPROACH (Right)  SURGEON:  Surgeon(s) and Role:    Mcarthur Rossetti, MD - Primary  PHYSICIAN ASSISTANT:  Benita Stabile, PA-C  ANESTHESIA:   general  COUNTS:  YES  DICTATION: .Other Dictation: Dictation Number [id number]  PLAN OF CARE: Admit for overnight observation  PATIENT DISPOSITION:  PACU - hemodynamically stable.   Delay start of Pharmacological VTE agent (>24hrs) due to surgical blood loss or risk of bleeding: no

## 2020-05-12 NOTE — Care Plan (Signed)
Ortho Bundle Case Management Note  Patient Details  Name: Danielle Villegas MRN: [id number] Date of Birth: 01-08-1956   [facility] call to patient to discuss her upcoming Right THA with Dr. Ninfa Linden on Friday, 05/12/20. Patient is an Ortho bundle through [facility]. Discussed this and patient agreeable to case management. Patient states she lives with her daughter, who is unable to help her with any care after surgery. She states she lives in an apartment with 18 steps to get to the 2nd level. She has requested SNF for rehab after surgery briefly. Patient requested CM contact [facility] for "pre-arranging" of SNF placement for rehab. CM did contact [facility], but has not heard back from them. Will need FL2 started after admission for placement. Has all DME needed at home already. No other DME needed. Will continue to follow for CM needs. Reviewed post-op instructions at length.                 DME Arranged:   (Patient has all DME needed for post-op care) DME Agency:     [facility] Arranged:    [place] Agency:     Additional Comments: Please contact me with any questions of if this plan should need to change.  Jamse Arn, RN, BSN, [employer]  [phone] 05/12/2020, 10:21 AM

## 2020-05-12 NOTE — Transfer of Care (Signed)
Immediate Anesthesia Transfer of Care Note  Patient: Danielle Villegas  Procedure(s) Performed: Procedure(s): RIGHT TOTAL HIP ARTHROPLASTY ANTERIOR APPROACH (Right)  Patient Location: PACU  Anesthesia Type:Spinal  Level of Consciousness:  sedated, patient cooperative and responds to stimulation  Airway & Oxygen Therapy:Patient Spontanous Breathing and Patient connected to face mask oxgen  Post-op Assessment:  Report given to PACU RN and Post -op Vital signs reviewed and stable  Post vital signs:  Reviewed and stable  Last Vitals:  Vitals:   05/12/20 0844 05/12/20 1225  BP: (!) 159/90 (!) 144/72  Pulse: 81 80  Resp: 18 13  Temp: 36.8 C (!) 36.4 C  SpO2: 72% 550%    Complications: No apparent anesthesia complications

## 2020-05-12 NOTE — Anesthesia Procedure Notes (Signed)
Procedure Name: Intubation Date/Time: 05/12/2020 10:57 AM Performed by: Anne Fu, CRNA Pre-anesthesia Checklist: Patient identified, Emergency Drugs available, Suction available, Patient being monitored and Timeout performed Patient Re-evaluated:Patient Re-evaluated prior to induction Oxygen Delivery Method: Circle system utilized Preoxygenation: Pre-oxygenation with 100% oxygen Induction Type: IV induction Ventilation: Mask ventilation without difficulty Laryngoscope Size: Mac and 4 Grade View: Grade I Tube type: Oral Tube size: 7.5 mm Number of attempts: 1 Airway Equipment and Method: Stylet Placement Confirmation: ETT inserted through vocal cords under direct vision,  positive ETCO2 and breath sounds checked- equal and bilateral Secured at: 19 cm Tube secured with: Tape Dental Injury: Teeth and Oropharynx as per pre-operative assessment

## 2020-05-12 NOTE — Anesthesia Postprocedure Evaluation (Signed)
Anesthesia Post Note  Patient: Danielle Villegas  Procedure(s) Performed: RIGHT TOTAL HIP ARTHROPLASTY ANTERIOR APPROACH (Right Hip)     Patient location during evaluation: PACU Anesthesia Type: General Level of consciousness: awake and alert, awake and oriented Pain management: pain level controlled Vital Signs Assessment: post-procedure vital signs reviewed and stable Respiratory status: spontaneous breathing, nonlabored ventilation, respiratory function stable and patient connected to nasal cannula oxygen Cardiovascular status: blood pressure returned to baseline and stable Postop Assessment: no apparent nausea or vomiting Anesthetic complications: no   No complications documented.  Last Vitals:  Vitals:   05/12/20 1415 05/12/20 1431  BP: (!) 156/81 133/83  Pulse: 73 79  Resp: 17 18  Temp: (!) 36.3 C 36.7 C  SpO2: 100% 100%    Last Pain:  Vitals:   05/12/20 1431  TempSrc: Oral  PainSc:                  [name]

## 2020-05-12 NOTE — Evaluation (Signed)
Physical Therapy Evaluation Patient Details Name: Danielle Villegas MRN: [id number] DOB: [birth year] Today's Date: 05/12/2020   History of Present Illness  Pt s/p R THR and with hx of RA, SZ, TIA, Lupus, and Fibromyalgia  Clinical Impression  Pt s/p R THR and presents with decreased R LE strength/ROM, post op pain and nausea and elevated anxiety level limiting functional mobility.  Pt would benefit from follow up rehab at SNF level to maximize IND and safety prior to dc home with very limited assist.    Follow Up Recommendations SNF    Equipment Recommendations  None recommended by PT    Recommendations for Other Services       Precautions / Restrictions Precautions Precautions: Fall Restrictions Weight Bearing Restrictions: No Other Position/Activity Restrictions: WBAT      Mobility  Bed Mobility Overal bed mobility: Needs Assistance Bed Mobility: Supine to Sit     Supine to sit: Mod assist     General bed mobility comments: Increased time with cues for sequence and use of L LE to self assist  Transfers Overall transfer level: Needs assistance Equipment used: Rolling walker (2 wheeled) Transfers: Sit to/from Stand Sit to Stand: Mod assist;+2 safety/equipment;From elevated surface         General transfer comment: cues for LE management and use of UEs to self assist  Ambulation/Gait Ambulation/Gait assistance: Min assist;Mod assist;+2 safety/equipment Gait Distance (Feet): 1 Feet Assistive device: Rolling walker (2 wheeled) Gait Pattern/deviations: Step-to pattern;Decreased step length - right;Decreased step length - left;Shuffle;Trunk flexed Gait velocity: decr   General Gait Details: cues for sequence, posture and postion from RW; distance ltd by nausea/dizziness  Stairs            Wheelchair Mobility    Modified Rankin (Stroke Patients Only)       Balance Overall balance assessment: Needs assistance Sitting-balance support: No upper  extremity supported;Feet supported Sitting balance-Leahy Scale: Fair     Standing balance support: Bilateral upper extremity supported Standing balance-Leahy Scale: Poor                               Pertinent Vitals/Pain Pain Assessment: 0-10 Pain Score: 4  Pain Location: R hip Pain Descriptors / Indicators: Aching;Sore Pain Intervention(s): Premedicated before session;Monitored during session;Limited activity within patient's tolerance    Home Living Family/patient expects to be discharged to:: Skilled nursing facility                      Prior Function Level of Independence: Independent               Hand Dominance        Extremity/Trunk Assessment   Upper Extremity Assessment Upper Extremity Assessment: Overall WFL for tasks assessed    Lower Extremity Assessment Lower Extremity Assessment: Generalized weakness    Cervical / Trunk Assessment Cervical / Trunk Assessment: Normal  Communication   Communication: No difficulties  Cognition Arousal/Alertness: Awake/alert Behavior During Therapy: WFL for tasks assessed/performed Overall Cognitive Status: Within Functional Limits for tasks assessed                                        General Comments      Exercises Total Joint Exercises Ankle Circles/Pumps: AROM;Both;15 reps;Supine   Assessment/Plan    PT Assessment Patient needs continued PT services  PT Problem List Decreased strength;Decreased range of motion;Decreased activity tolerance;Decreased balance;Decreased mobility;Decreased knowledge of use of DME;Pain       PT Treatment Interventions DME instruction;Gait training;Functional mobility training;Therapeutic activities;Balance training;Therapeutic exercise;Patient/family education    PT Goals (Current goals can be found in the Care Plan section)  Acute Rehab PT Goals Patient Stated Goal: Rehab and then home with limited assist PT Goal Formulation:  With patient Time For Goal Achievement: 05/19/20 Potential to Achieve Goals: Good    Frequency 7X/week   Barriers to discharge Decreased caregiver support      Co-evaluation               AM-PAC PT "6 Clicks" Mobility  Outcome Measure Help needed turning from your back to your side while in a flat bed without using bedrails?: A Lot Help needed moving from lying on your back to sitting on the side of a flat bed without using bedrails?: A Lot Help needed moving to and from a bed to a chair (including a wheelchair)?: A Lot Help needed standing up from a chair using your arms (e.g., wheelchair or bedside chair)?: A Lot Help needed to walk in hospital room?: A Lot Help needed climbing 3-5 steps with a railing? : Total 6 Click Score: 11    End of Session Equipment Utilized During Treatment: Gait belt Activity Tolerance: Patient limited by fatigue;Other (comment) (nausea) Patient left: in bed;with call bell/phone within reach;with nursing/sitter in room Nurse Communication: Mobility status PT Visit Diagnosis: Difficulty in walking, not elsewhere classified (R26.2)    Time: 1745-1830 PT Time Calculation (min) (ACUTE ONLY): 45 min   Charges:   PT Evaluation $PT Eval Low Complexity: 1 Low PT Treatments $Therapeutic Activity: 23-37 mins        Debe Coder PT Acute Rehabilitation Services Pager [phone] Office [phone]   [facility] 05/12/2020, 7:39 PM

## 2020-05-13 DIAGNOSIS — M069 Rheumatoid arthritis, unspecified: Secondary | ICD-10-CM | POA: Diagnosis present

## 2020-05-13 DIAGNOSIS — M329 Systemic lupus erythematosus, unspecified: Secondary | ICD-10-CM | POA: Diagnosis present

## 2020-05-13 DIAGNOSIS — Z20822 Contact with and (suspected) exposure to covid-19: Secondary | ICD-10-CM | POA: Diagnosis present

## 2020-05-13 DIAGNOSIS — M797 Fibromyalgia: Secondary | ICD-10-CM | POA: Diagnosis present

## 2020-05-13 DIAGNOSIS — I350 Nonrheumatic aortic (valve) stenosis: Secondary | ICD-10-CM | POA: Diagnosis not present

## 2020-05-13 DIAGNOSIS — Z8673 Personal history of transient ischemic attack (TIA), and cerebral infarction without residual deficits: Secondary | ICD-10-CM | POA: Diagnosis not present

## 2020-05-13 DIAGNOSIS — M62838 Other muscle spasm: Secondary | ICD-10-CM | POA: Diagnosis not present

## 2020-05-13 DIAGNOSIS — Z79899 Other long term (current) drug therapy: Secondary | ICD-10-CM | POA: Diagnosis not present

## 2020-05-13 DIAGNOSIS — R011 Cardiac murmur, unspecified: Secondary | ICD-10-CM | POA: Diagnosis not present

## 2020-05-13 DIAGNOSIS — I10 Essential (primary) hypertension: Secondary | ICD-10-CM | POA: Diagnosis present

## 2020-05-13 DIAGNOSIS — F419 Anxiety disorder, unspecified: Secondary | ICD-10-CM | POA: Diagnosis present

## 2020-05-13 DIAGNOSIS — D649 Anemia, unspecified: Secondary | ICD-10-CM | POA: Diagnosis not present

## 2020-05-13 DIAGNOSIS — Z85828 Personal history of other malignant neoplasm of skin: Secondary | ICD-10-CM | POA: Diagnosis not present

## 2020-05-13 DIAGNOSIS — R5381 Other malaise: Secondary | ICD-10-CM | POA: Diagnosis not present

## 2020-05-13 DIAGNOSIS — R279 Unspecified lack of coordination: Secondary | ICD-10-CM | POA: Diagnosis not present

## 2020-05-13 DIAGNOSIS — Z7901 Long term (current) use of anticoagulants: Secondary | ICD-10-CM | POA: Diagnosis not present

## 2020-05-13 DIAGNOSIS — M1711 Unilateral primary osteoarthritis, right knee: Secondary | ICD-10-CM | POA: Diagnosis not present

## 2020-05-13 DIAGNOSIS — D6851 Activated protein C resistance: Secondary | ICD-10-CM | POA: Diagnosis present

## 2020-05-13 DIAGNOSIS — M25551 Pain in right hip: Secondary | ICD-10-CM | POA: Diagnosis not present

## 2020-05-13 DIAGNOSIS — I82429 Acute embolism and thrombosis of unspecified iliac vein: Secondary | ICD-10-CM | POA: Diagnosis not present

## 2020-05-13 DIAGNOSIS — R569 Unspecified convulsions: Secondary | ICD-10-CM | POA: Diagnosis not present

## 2020-05-13 DIAGNOSIS — Z86718 Personal history of other venous thrombosis and embolism: Secondary | ICD-10-CM | POA: Diagnosis not present

## 2020-05-13 DIAGNOSIS — T7840XD Allergy, unspecified, subsequent encounter: Secondary | ICD-10-CM | POA: Diagnosis not present

## 2020-05-13 DIAGNOSIS — Z96641 Presence of right artificial hip joint: Secondary | ICD-10-CM | POA: Diagnosis not present

## 2020-05-13 DIAGNOSIS — Z96652 Presence of left artificial knee joint: Secondary | ICD-10-CM | POA: Diagnosis present

## 2020-05-13 DIAGNOSIS — M1611 Unilateral primary osteoarthritis, right hip: Secondary | ICD-10-CM | POA: Diagnosis present

## 2020-05-13 DIAGNOSIS — Z743 Need for continuous supervision: Secondary | ICD-10-CM | POA: Diagnosis not present

## 2020-05-13 DIAGNOSIS — Z7952 Long term (current) use of systemic steroids: Secondary | ICD-10-CM | POA: Diagnosis not present

## 2020-05-13 DIAGNOSIS — Z4789 Encounter for other orthopedic aftercare: Secondary | ICD-10-CM | POA: Diagnosis not present

## 2020-05-13 LAB — CBC
HCT: 34.8 % — ABNORMAL LOW (ref 36.0–46.0)
Hemoglobin: 11.1 g/dL — ABNORMAL LOW (ref 12.0–15.0)
MCH: 31.6 pg (ref 26.0–34.0)
MCHC: 31.9 g/dL (ref 30.0–36.0)
MCV: 99.1 fL (ref 80.0–100.0)
Platelets: 130 10*3/uL — ABNORMAL LOW (ref 150–400)
RBC: 3.51 MIL/uL — ABNORMAL LOW (ref 3.87–5.11)
RDW: 13.5 % (ref 11.5–15.5)
WBC: 5.8 10*3/uL (ref 4.0–10.5)
nRBC: 0 % (ref 0.0–0.2)

## 2020-05-13 LAB — BASIC METABOLIC PANEL
Anion gap: 5 (ref 5–15)
BUN: 18 mg/dL (ref 8–23)
CO2: 25 mmol/L (ref 22–32)
Calcium: 8.3 mg/dL — ABNORMAL LOW (ref 8.9–10.3)
Chloride: 102 mmol/L (ref 98–111)
Creatinine, Ser: 0.93 mg/dL (ref 0.44–1.00)
GFR calc Af Amer: >60 mL/min (ref >60)
GFR calc non Af Amer: >60 mL/min (ref >60)
Glucose, Bld: 103 mg/dL — ABNORMAL HIGH (ref 70–99)
Potassium: 4.1 mmol/L (ref 3.5–5.1)
Sodium: 132 mmol/L — ABNORMAL LOW (ref 135–145)

## 2020-05-13 MED ORDER — TRAMADOL HCL 50 MG PO TABS
50.0000 mg | ORAL_TABLET | Freq: Four times a day (QID) | ORAL | Status: DC | PRN
  Administered 2020-05-13 – 2020-05-14 (×2): 50 mg via ORAL
  Filled 2020-05-13 (×3): qty 1

## 2020-05-13 MED ORDER — HYDROCODONE-ACETAMINOPHEN 5-325 MG PO TABS
1.0000 | ORAL_TABLET | Freq: Four times a day (QID) | ORAL | Status: DC | PRN
  Administered 2020-05-13 – 2020-05-14 (×4): 1 via ORAL
  Administered 2020-05-15 (×2): 2 via ORAL
  Filled 2020-05-13: qty 1
  Filled 2020-05-13: qty 2
  Filled 2020-05-13 (×2): qty 1
  Filled 2020-05-13: qty 2
  Filled 2020-05-13: qty 1

## 2020-05-13 NOTE — Progress Notes (Signed)
Dr. Ninfa Linden notified of pt temp of 100.1 on rounds. Rn will continue to monitor.

## 2020-05-13 NOTE — Discharge Instructions (Signed)

## 2020-05-13 NOTE — Plan of Care (Signed)
°  Problem: Clinical Measurements: Goal: Respiratory complications will improve Outcome: Progressing   Problem: Clinical Measurements: Goal: Cardiovascular complication will be avoided Outcome: Progressing   Problem: Pain Managment: Goal: General experience of comfort will improve Outcome: Progressing   Problem: Skin Integrity: Goal: Risk for impaired skin integrity will decrease Outcome: Progressing   Problem: Elimination: Goal: Will not experience complications related to bowel motility Outcome: Progressing

## 2020-05-13 NOTE — Progress Notes (Signed)
Physical Therapy Treatment Patient Details Name: Danielle Villegas MRN: [id number] DOB: 09-28-1956 Today's Date: 05/13/2020    History of Present Illness Pt s/p R THR and with hx of RA, SZ, TIA, Lupus, and Fibromyalgia    PT Comments    Pt continues to fatigue easily but with noted improvement in activity tolerance this pm.   Follow Up Recommendations  SNF     Equipment Recommendations  None recommended by PT    Recommendations for Other Services       Precautions / Restrictions Precautions Precautions: Fall Restrictions Weight Bearing Restrictions: No Other Position/Activity Restrictions: WBAT    Mobility  Bed Mobility               General bed mobility comments: Pt up in chair and requests back to same  Transfers Overall transfer level: Needs assistance Equipment used: Rolling walker (2 wheeled) Transfers: Sit to/from Stand Sit to Stand: Mod assist         General transfer comment: cues for LE management and use of UEs to self assist: stand/pvt bed to [facility] with RW  Ambulation/Gait Ambulation/Gait assistance: Min assist Gait Distance (Feet): 39 Feet Assistive device: Rolling walker (2 wheeled) Gait Pattern/deviations: Step-to pattern;Decreased step length - right;Decreased step length - left;Shuffle;Trunk flexed Gait velocity: decr   General Gait Details: cues for sequence, posture and postion from RW; distance ltd by fatigue   Stairs             Wheelchair Mobility    Modified Rankin (Stroke Patients Only)       Balance Overall balance assessment: Needs assistance Sitting-balance support: No upper extremity supported;Feet supported Sitting balance-Leahy Scale: Good     Standing balance support: Bilateral upper extremity supported Standing balance-Leahy Scale: Poor                              Cognition Arousal/Alertness: Awake/alert Behavior During Therapy: WFL for tasks assessed/performed Overall Cognitive  Status: Within Functional Limits for tasks assessed                                        Exercises Total Joint Exercises Ankle Circles/Pumps: AROM;Both;15 reps;Supine    General Comments        Pertinent Vitals/Pain Pain Assessment: 0-10 Pain Score: 4  Pain Location: R hip Pain Descriptors / Indicators: Aching;Sore Pain Intervention(s): Limited activity within patient's tolerance;Premedicated before session;Monitored during session;Ice applied    Home Living                      Prior Function            PT Goals (current goals can now be found in the care plan section) Acute Rehab PT Goals Patient Stated Goal: Rehab and then home with limited assist PT Goal Formulation: With patient Time For Goal Achievement: 05/19/20 Potential to Achieve Goals: Good Progress towards PT goals: Progressing toward goals    Frequency    7X/week      PT Plan Current plan remains appropriate    Co-evaluation              AM-PAC PT "6 Clicks" Mobility   Outcome Measure  Help needed turning from your back to your side while in a flat bed without using bedrails?: A Lot Help needed moving from lying on your  back to sitting on the side of a flat bed without using bedrails?: A Lot Help needed moving to and from a bed to a chair (including a wheelchair)?: A Lot Help needed standing up from a chair using your arms (e.g., wheelchair or bedside chair)?: A Lot Help needed to walk in hospital room?: A Little Help needed climbing 3-5 steps with a railing? : A Lot 6 Click Score: 13    End of Session Equipment Utilized During Treatment: Gait belt Activity Tolerance: Patient tolerated treatment well;Patient limited by fatigue Patient left: in chair;with call bell/phone within reach;with nursing/sitter in room Nurse Communication: Mobility status PT Visit Diagnosis: Difficulty in walking, not elsewhere classified (R26.2)     Time: 1440-1456 PT Time  Calculation (min) (ACUTE ONLY): 16 min  Charges:  $Gait Training: 8-22 mins                     [place] Pager [phone] Office [phone]    Minaal Struckman 05/13/2020, 3:41 PM

## 2020-05-13 NOTE — Progress Notes (Signed)
Subjective: 1 Day Post-Op Procedure(s) (LRB): RIGHT TOTAL HIP ARTHROPLASTY ANTERIOR APPROACH (Right) Patient reports pain as moderate.  Significant nausea yesterday and light-headedness from surgery and pain meds.  Objective: Vital signs in last 24 hours: Temp:  [97.4 F (36.3 C)-100.1 F (37.8 C)] 100.1 F (37.8 C) (08/14 0941) Pulse Rate:  [58-88] 79 (08/14 0941) Resp:  [8-20] 16 (08/14 0941) BP: (127-156)/(56-85) 132/65 (08/14 0941) SpO2:  [96 %-100 %] 96 % (08/14 0941) Weight:  [64.4 kg] 64.4 kg (08/13 1431)  Intake/Output from previous day: 08/13 0701 - 08/14 0700 In: 2792.1 [P.O.:720; I.V.:1729.8; IV Piggyback:342.3] Out: 2000 [Urine:1700; Emesis/NG output:200; Blood:100] Intake/Output this shift: Total I/O In: 795.3 [P.O.:720; I.V.:75.3] Out: 700 [Urine:700]  Recent Labs    05/13/20 0310  HGB 11.1*   Recent Labs    05/13/20 0310  WBC 5.8  RBC 3.51*  HCT 34.8*  PLT 130*   Recent Labs    05/13/20 0310  NA 132*  K 4.1  CL 102  CO2 25  BUN 18  CREATININE 0.93  GLUCOSE 103*  CALCIUM 8.3*   No results for input(s): LABPT, INR in the last 72 hours.  Sensation intact distally Intact pulses distally Dorsiflexion/Plantar flexion intact Incision: dressing C/D/I   Assessment/Plan: 1 Day Post-Op Procedure(s) (LRB): RIGHT TOTAL HIP ARTHROPLASTY ANTERIOR APPROACH (Right) Up with therapy Discharge to SNF on Monday Will change to Inpatient Admission due to the need for short-term skilled nursing given her slow mobility combined with no family support at home.     Mcarthur Rossetti 05/13/2020, 10:55 AM

## 2020-05-13 NOTE — Progress Notes (Signed)
Physical Therapy Treatment Patient Details Name: Danielle Villegas MRN: [id number] DOB: [birth year] Today's Date: 05/13/2020    History of Present Illness Pt s/p R THR and with hx of RA, SZ, TIA, Lupus, and Fibromyalgia    PT Comments    Pt with improvement in activity tolerance but continues to require increased time for all tasks and fatigues easily.   Follow Up Recommendations  SNF     Equipment Recommendations  None recommended by PT    Recommendations for Other Services       Precautions / Restrictions Precautions Precautions: Fall Restrictions Weight Bearing Restrictions: No Other Position/Activity Restrictions: WBAT    Mobility  Bed Mobility Overal bed mobility: Needs Assistance Bed Mobility: Supine to Sit     Supine to sit: Min assist;Mod assist     General bed mobility comments: Increased time with cues for sequence and use of L LE to self assist  Transfers Overall transfer level: Needs assistance Equipment used: Rolling walker (2 wheeled) Transfers: Sit to/from [facility] Sit to Stand: Mod assist;From elevated surface Stand pivot transfers: Mod assist       General transfer comment: cues for LE management and use of UEs to self assist: stand/pvt bed to [facility] with RW  Ambulation/Gait Ambulation/Gait assistance: Min assist;Mod assist Gait Distance (Feet): 3 Feet Assistive device: Rolling walker (2 wheeled) Gait Pattern/deviations: Step-to pattern;Decreased step length - right;Decreased step length - left;Shuffle;Trunk flexed Gait velocity: decr   General Gait Details: cues for sequence, posture and postion from RW; distance ltd by nausea/fatigue   Stairs             Wheelchair Mobility    Modified Rankin (Stroke Patients Only)       Balance Overall balance assessment: Needs assistance Sitting-balance support: No upper extremity supported;Feet supported Sitting balance-Leahy Scale: Good     Standing balance  support: Bilateral upper extremity supported Standing balance-Leahy Scale: Poor                              Cognition Arousal/Alertness: Awake/alert Behavior During Therapy: WFL for tasks assessed/performed Overall Cognitive Status: Within Functional Limits for tasks assessed                                        Exercises      General Comments        Pertinent Vitals/Pain Pain Assessment: 0-10 Pain Score: 4  Pain Location: R hip Pain Descriptors / Indicators: Aching;Sore Pain Intervention(s): Limited activity within patient's tolerance;Monitored during session;Premedicated before session;Ice applied    Home Living                      Prior Function            PT Goals (current goals can now be found in the care plan section) Acute Rehab PT Goals Patient Stated Goal: Rehab and then home with limited assist PT Goal Formulation: With patient Time For Goal Achievement: 05/19/20 Potential to Achieve Goals: Good Progress towards PT goals: Progressing toward goals    Frequency    7X/week      PT Plan Current plan remains appropriate    Co-evaluation              AM-PAC PT "6 Clicks" Mobility   Outcome Measure  Help needed turning from  your back to your side while in a flat bed without using bedrails?: A Lot Help needed moving from lying on your back to sitting on the side of a flat bed without using bedrails?: A Lot Help needed moving to and from a bed to a chair (including a wheelchair)?: A Lot Help needed standing up from a chair using your arms (e.g., wheelchair or bedside chair)?: A Lot Help needed to walk in hospital room?: A Lot Help needed climbing 3-5 steps with a railing? : Total 6 Click Score: 11    End of Session         PT Visit Diagnosis: Difficulty in walking, not elsewhere classified (R26.2)     Time: 0156-1537 PT Time Calculation (min) (ACUTE ONLY): 33 min  Charges:  $Gait Training: 8-22  mins $Therapeutic Activity: 8-22 mins                     [place] Pager [phone] Office [phone]    Mckenley Birenbaum 05/13/2020, 12:52 PM

## 2020-05-14 ENCOUNTER — Encounter (HOSPITAL_COMMUNITY): Payer: Self-pay | Admitting: Orthopaedic Surgery

## 2020-05-14 NOTE — Plan of Care (Signed)
Continuing to monitor patient for signs and symptoms of infection. Deanna Artis Rn

## 2020-05-14 NOTE — Progress Notes (Signed)
Physical Therapy Treatment Patient Details Name: Danielle Villegas MRN: [id number] DOB: [birth year] Today's Date: 05/14/2020    History of Present Illness Pt s/p R THR and with hx of RA, SZ, TIA, Lupus, and Fibromyalgia    PT Comments    Pt requiring increased time with all tasks but with marked improvement in activity tolerance and with noted improved pain control.   Follow Up Recommendations  SNF     Equipment Recommendations  None recommended by PT    Recommendations for Other Services       Precautions / Restrictions Precautions Precautions: Fall Restrictions Weight Bearing Restrictions: No Other Position/Activity Restrictions: WBAT    Mobility  Bed Mobility               General bed mobility comments: Pt up in chair and requests back to same  Transfers Overall transfer level: Needs assistance Equipment used: Rolling walker (2 wheeled) Transfers: Sit to/from Stand Sit to Stand: Min assist;Mod assist         General transfer comment: cues for LE management and use of UEs to self assist:  Pt struggling 2* L knee pain  Ambulation/Gait Ambulation/Gait assistance: Min assist Gait Distance (Feet): 90 Feet Assistive device: Rolling walker (2 wheeled) Gait Pattern/deviations: Step-to pattern;Decreased step length - right;Decreased step length - left;Shuffle;Trunk flexed Gait velocity: decr   General Gait Details: Increased time with cues for sequence, posture and postion from RW;    Chief Strategy Officer    Modified Rankin (Stroke Patients Only)       Balance Overall balance assessment: Needs assistance Sitting-balance support: No upper extremity supported;Feet supported Sitting balance-Leahy Scale: Good     Standing balance support: Bilateral upper extremity supported Standing balance-Leahy Scale: Poor                              Cognition Arousal/Alertness: Awake/alert Behavior During Therapy: WFL for  tasks assessed/performed Overall Cognitive Status: Within Functional Limits for tasks assessed                                        Exercises      General Comments        Pertinent Vitals/Pain Pain Assessment: 0-10 Pain Score: 5  Pain Location: R hip/thigh  Pain Descriptors / Indicators: Aching;Sore;Grimacing;Guarding;Burning Pain Intervention(s): Limited activity within patient's tolerance;Monitored during session;Premedicated before session;Ice applied    Home Living                      Prior Function            PT Goals (current goals can now be found in the care plan section) Acute Rehab PT Goals Patient Stated Goal: Rehab and then home with limited assist PT Goal Formulation: With patient Time For Goal Achievement: 05/19/20 Potential to Achieve Goals: Good Progress towards PT goals: Progressing toward goals    Frequency    7X/week      PT Plan Current plan remains appropriate    Co-evaluation              AM-PAC PT "6 Clicks" Mobility   Outcome Measure  Help needed turning from your back to your side while in a flat bed without using bedrails?: A Lot Help needed moving  from lying on your back to sitting on the side of a flat bed without using bedrails?: A Lot Help needed moving to and from a bed to a chair (including a wheelchair)?: A Lot Help needed standing up from a chair using your arms (e.g., wheelchair or bedside chair)?: A Lot Help needed to walk in hospital room?: A Lot Help needed climbing 3-5 steps with a railing? : Total 6 Click Score: 11    End of Session Equipment Utilized During Treatment: Gait belt Activity Tolerance: Patient tolerated treatment well Patient left: in chair;with call bell/phone within reach Nurse Communication: Mobility status PT Visit Diagnosis: Difficulty in walking, not elsewhere classified (R26.2)     Time: 4132-4401 PT Time Calculation (min) (ACUTE ONLY): 19 min  Charges:   $Gait Training: 8-22 mins                     [place] Pager [phone] Office [phone]    Danielle Villegas 05/14/2020, 2:53 PM

## 2020-05-14 NOTE — Progress Notes (Addendum)
   Subjective: 2 Days Post-Op Procedure(s) (LRB): RIGHT TOTAL HIP ARTHROPLASTY ANTERIOR APPROACH (Right) Patient reports pain as moderate.    Objective: Vital signs in last 24 hours: Temp:  [99 F (37.2 C)-100.1 F (37.8 C)] 99 F (37.2 C) (08/15 0535) Pulse Rate:  [78-93] 93 (08/15 0535) Resp:  [14-19] 19 (08/15 0535) BP: (107-143)/(54-83) 143/83 (08/15 0535) SpO2:  [96 %-100 %] 96 % (08/15 0535)  Intake/Output from previous day: 08/14 0701 - 08/15 0700 In: 2355.3 [P.O.:2280; I.V.:75.3] Out: 900 [Urine:900] Intake/Output this shift: Total I/O In: -  Out: 350 [Urine:350]  Recent Labs    05/13/20 0310  HGB 11.1*   Recent Labs    05/13/20 0310  WBC 5.8  RBC 3.51*  HCT 34.8*  PLT 130*   Recent Labs    05/13/20 0310  NA 132*  K 4.1  CL 102  CO2 25  BUN 18  CREATININE 0.93  GLUCOSE 103*  CALCIUM 8.3*   No results for input(s): LABPT, INR in the last 72 hours.  Neurologically intact No results found.  Assessment/Plan: 2 Days Post-Op Procedure(s) (LRB): RIGHT TOTAL HIP ARTHROPLASTY ANTERIOR APPROACH (Right) Up with therapy. Has medicare and supp. Policy, daughter at home with severe anxiety. 15 steps to get to second floor appt. Will need to be able to get to [facility] and back on her own to go home.   Danielle Villegas 05/14/2020, 9:39 AM

## 2020-05-14 NOTE — Progress Notes (Signed)
Physical Therapy Treatment Patient Details Name: Danielle Villegas MRN: [id number] DOB: 08-27-1956 Today's Date: 05/14/2020    History of Present Illness Pt s/p R THR and with hx of RA, SZ, TIA, Lupus, and Fibromyalgia    PT Comments    Pt performed therex program with assistance and with increased time and multiple rest breaks for task completion.  Follow Up Recommendations  SNF     Equipment Recommendations  None recommended by PT    Recommendations for Other Services       Precautions / Restrictions Precautions Precautions: Fall Restrictions Weight Bearing Restrictions: No Other Position/Activity Restrictions: WBAT    Mobility  Bed Mobility               General bed mobility comments: Pt up in chair and requests back to same  Transfers Overall transfer level: Needs assistance Equipment used: Rolling walker (2 wheeled) Transfers: Sit to/from Stand Sit to Stand: Min assist;Mod assist         General transfer comment: cues for LE management and use of UEs to self assist:  Pt struggling 2* L knee pain  Ambulation/Gait Ambulation/Gait assistance: Min assist Gait Distance (Feet): 90 Feet Assistive device: Rolling walker (2 wheeled) Gait Pattern/deviations: Step-to pattern;Decreased step length - right;Decreased step length - left;Shuffle;Trunk flexed Gait velocity: decr   General Gait Details: Increased time with cues for sequence, posture and postion from RW;    Chief Strategy Officer    Modified Rankin (Stroke Patients Only)       Balance Overall balance assessment: Needs assistance Sitting-balance support: No upper extremity supported;Feet supported Sitting balance-Leahy Scale: Good     Standing balance support: Bilateral upper extremity supported Standing balance-Leahy Scale: Poor                              Cognition Arousal/Alertness: Awake/alert Behavior During Therapy: WFL for tasks  assessed/performed Overall Cognitive Status: Within Functional Limits for tasks assessed                                        Exercises Total Joint Exercises Ankle Circles/Pumps: AROM;Both;15 reps;Supine Quad Sets: AROM;Both;10 reps;Supine Heel Slides: AAROM;Right;20 reps;Supine Hip ABduction/ADduction: AAROM;Right;15 reps;Supine    General Comments        Pertinent Vitals/Pain Pain Assessment: 0-10 Pain Score: 4  Pain Location: R hip/thigh  Pain Descriptors / Indicators: Aching;Sore;Burning;Guarding;Grimacing Pain Intervention(s): Limited activity within patient's tolerance;Monitored during session;Premedicated before session;Ice applied    Home Living                      Prior Function            PT Goals (current goals can now be found in the care plan section) Acute Rehab PT Goals Patient Stated Goal: Rehab and then home with limited assist PT Goal Formulation: With patient Time For Goal Achievement: 05/19/20 Potential to Achieve Goals: Good Progress towards PT goals: Progressing toward goals    Frequency    7X/week      PT Plan Current plan remains appropriate    Co-evaluation              AM-PAC PT "6 Clicks" Mobility   Outcome Measure  Help needed turning from your back to your side  while in a flat bed without using bedrails?: A Lot Help needed moving from lying on your back to sitting on the side of a flat bed without using bedrails?: A Lot Help needed moving to and from a bed to a chair (including a wheelchair)?: A Lot Help needed standing up from a chair using your arms (e.g., wheelchair or bedside chair)?: A Lot Help needed to walk in hospital room?: A Lot Help needed climbing 3-5 steps with a railing? : Total 6 Click Score: 11    End of Session Equipment Utilized During Treatment: Gait belt Activity Tolerance: Patient tolerated treatment well Patient left: in chair;with call bell/phone within reach Nurse  Communication: Mobility status PT Visit Diagnosis: Difficulty in walking, not elsewhere classified (R26.2)     Time: 1610-9604 PT Time Calculation (min) (ACUTE ONLY): 20 min  Charges:  $Gait Training: 8-22 mins $Therapeutic Exercise: 8-22 mins                     [place] Pager [phone] Office [phone]    Jessye Imhoff 05/14/2020, 4:46 PM

## 2020-05-14 NOTE — Progress Notes (Signed)
Physical Therapy Treatment Patient Details Name: Danielle Villegas MRN: [id number] DOB: [birth year] Today's Date: 05/14/2020    History of Present Illness Pt s/p R THR and with hx of RA, SZ, TIA, Lupus, and Fibromyalgia    PT Comments    Pt very cooperative but ltd this am by nausea and with increased R hip/thigh pain with attempts to WB   Follow Up Recommendations  SNF     Equipment Recommendations  None recommended by PT    Recommendations for Other Services       Precautions / Restrictions Precautions Precautions: Fall Restrictions Weight Bearing Restrictions: No Other Position/Activity Restrictions: WBAT    Mobility  Bed Mobility               General bed mobility comments: Pt sitting EOB with RN on arrival to room  Transfers Overall transfer level: Needs assistance Equipment used: Rolling walker (2 wheeled) Transfers: Sit to/from Stand Sit to Stand: Min assist;Mod assist;From elevated surface         General transfer comment: cues for LE management and use of UEs to self assist:  Pt struggling 2* L knee pain  Ambulation/Gait Ambulation/Gait assistance: Min assist;+2 safety/equipment Gait Distance (Feet): 2 Feet Assistive device: Rolling walker (2 wheeled) Gait Pattern/deviations: Step-to pattern;Decreased step length - right;Decreased step length - left;Shuffle;Trunk flexed Gait velocity: decr   General Gait Details: cues for sequence, posture and postion from RW; distance ltd by increasing nausea and increased pain with attempts to [employer]   Stairs             Wheelchair Mobility    Modified Rankin (Stroke Patients Only)       Balance Overall balance assessment: Needs assistance Sitting-balance support: No upper extremity supported;Feet supported Sitting balance-Leahy Scale: Good     Standing balance support: Bilateral upper extremity supported Standing balance-Leahy Scale: Poor                              Cognition  Arousal/Alertness: Awake/alert Behavior During Therapy: WFL for tasks assessed/performed Overall Cognitive Status: Within Functional Limits for tasks assessed                                        Exercises      General Comments        Pertinent Vitals/Pain Pain Assessment: 0-10 Pain Score: 8  Pain Location: R hip/thigh with attempts to WB Pain Descriptors / Indicators: Aching;Sore;Grimacing;Guarding;Burning Pain Intervention(s): Limited activity within patient's tolerance;Monitored during session;Premedicated before session;Ice applied    Home Living                      Prior Function            PT Goals (current goals can now be found in the care plan section) Acute Rehab PT Goals Patient Stated Goal: Rehab and then home with limited assist PT Goal Formulation: With patient Time For Goal Achievement: 05/19/20 Potential to Achieve Goals: Good Progress towards PT goals: Not progressing toward goals - comment (Ltd by increased pain and nausea)    Frequency    7X/week      PT Plan Current plan remains appropriate    Co-evaluation              AM-PAC PT "6 Clicks" Mobility   Outcome Measure  Help needed turning from your back to your side while in a flat bed without using bedrails?: A Lot Help needed moving from lying on your back to sitting on the side of a flat bed without using bedrails?: A Lot Help needed moving to and from a bed to a chair (including a wheelchair)?: A Lot Help needed standing up from a chair using your arms (e.g., wheelchair or bedside chair)?: A Lot Help needed to walk in hospital room?: A Lot Help needed climbing 3-5 steps with a railing? : Total 6 Click Score: 11    End of Session Equipment Utilized During Treatment: Gait belt Activity Tolerance: Patient limited by pain;Patient limited by fatigue Patient left: in chair;with call bell/phone within reach Nurse Communication: Mobility status PT Visit  Diagnosis: Difficulty in walking, not elsewhere classified (R26.2)     Time: 1050-1110 PT Time Calculation (min) (ACUTE ONLY): 20 min  Charges:  $Gait Training: 8-22 mins                     05/14/2020   Tanieka Pownall 05/14/2020, 12:49 PM

## 2020-05-15 ENCOUNTER — Encounter (HOSPITAL_COMMUNITY): Payer: Self-pay | Admitting: Orthopaedic Surgery

## 2020-05-15 DIAGNOSIS — R5381 Other malaise: Secondary | ICD-10-CM | POA: Diagnosis not present

## 2020-05-15 DIAGNOSIS — M25551 Pain in right hip: Secondary | ICD-10-CM | POA: Diagnosis not present

## 2020-05-15 DIAGNOSIS — M62838 Other muscle spasm: Secondary | ICD-10-CM | POA: Diagnosis not present

## 2020-05-15 DIAGNOSIS — Z96641 Presence of right artificial hip joint: Secondary | ICD-10-CM | POA: Diagnosis not present

## 2020-05-15 DIAGNOSIS — I350 Nonrheumatic aortic (valve) stenosis: Secondary | ICD-10-CM | POA: Diagnosis not present

## 2020-05-15 DIAGNOSIS — R569 Unspecified convulsions: Secondary | ICD-10-CM | POA: Diagnosis not present

## 2020-05-15 DIAGNOSIS — R279 Unspecified lack of coordination: Secondary | ICD-10-CM | POA: Diagnosis not present

## 2020-05-15 DIAGNOSIS — M069 Rheumatoid arthritis, unspecified: Secondary | ICD-10-CM | POA: Diagnosis not present

## 2020-05-15 DIAGNOSIS — I10 Essential (primary) hypertension: Secondary | ICD-10-CM | POA: Diagnosis not present

## 2020-05-15 DIAGNOSIS — M797 Fibromyalgia: Secondary | ICD-10-CM | POA: Diagnosis not present

## 2020-05-15 DIAGNOSIS — M1611 Unilateral primary osteoarthritis, right hip: Secondary | ICD-10-CM | POA: Diagnosis not present

## 2020-05-15 DIAGNOSIS — R011 Cardiac murmur, unspecified: Secondary | ICD-10-CM | POA: Diagnosis not present

## 2020-05-15 DIAGNOSIS — Z743 Need for continuous supervision: Secondary | ICD-10-CM | POA: Diagnosis not present

## 2020-05-15 DIAGNOSIS — D649 Anemia, unspecified: Secondary | ICD-10-CM | POA: Diagnosis not present

## 2020-05-15 DIAGNOSIS — Z8673 Personal history of transient ischemic attack (TIA), and cerebral infarction without residual deficits: Secondary | ICD-10-CM | POA: Diagnosis not present

## 2020-05-15 DIAGNOSIS — T7840XD Allergy, unspecified, subsequent encounter: Secondary | ICD-10-CM | POA: Diagnosis not present

## 2020-05-15 DIAGNOSIS — F419 Anxiety disorder, unspecified: Secondary | ICD-10-CM | POA: Diagnosis not present

## 2020-05-15 DIAGNOSIS — M329 Systemic lupus erythematosus, unspecified: Secondary | ICD-10-CM | POA: Diagnosis not present

## 2020-05-15 DIAGNOSIS — Z86718 Personal history of other venous thrombosis and embolism: Secondary | ICD-10-CM | POA: Diagnosis not present

## 2020-05-15 DIAGNOSIS — Z4789 Encounter for other orthopedic aftercare: Secondary | ICD-10-CM | POA: Diagnosis not present

## 2020-05-15 DIAGNOSIS — M1711 Unilateral primary osteoarthritis, right knee: Secondary | ICD-10-CM | POA: Diagnosis not present

## 2020-05-15 DIAGNOSIS — D6851 Activated protein C resistance: Secondary | ICD-10-CM | POA: Diagnosis not present

## 2020-05-15 DIAGNOSIS — I82429 Acute embolism and thrombosis of unspecified iliac vein: Secondary | ICD-10-CM | POA: Diagnosis not present

## 2020-05-15 LAB — SARS CORONAVIRUS 2 (TAT 6-24 HRS): SARS Coronavirus 2: NEGATIVE

## 2020-05-15 MED ORDER — HYDROCODONE-ACETAMINOPHEN 5-325 MG PO TABS: 1.0000 | ORAL_TABLET | Freq: Four times a day (QID) | ORAL | 0 refills | Status: AC | PRN

## 2020-05-15 MED ORDER — METHOCARBAMOL 500 MG PO TABS: 500.0000 mg | ORAL_TABLET | Freq: Four times a day (QID) | ORAL | 0 refills | Status: DC | PRN

## 2020-05-15 MED ORDER — TRAMADOL HCL 50 MG PO TABS: 50.0000 mg | ORAL_TABLET | Freq: Four times a day (QID) | ORAL | 0 refills | Status: AC | PRN

## 2020-05-15 NOTE — Progress Notes (Signed)
Danielle Villegas, the receiving nurse at [place], was given report.

## 2020-05-15 NOTE — NC FL2 (Signed)
[place] LEVEL OF CARE SCREENING TOOL     IDENTIFICATION  Patient Name: Danielle Villegas Birthdate: [birth year] Sex: female Admission Date (Current Location): 05/12/2020  [facility] and [place] Number:  Herbalist and Address:  [facility],  [place], [place]      Provider Number: [id number]  Attending Physician Name and Address:  Mcarthur Rossetti  Relative Name and Phone Number:       Current Level of Care: Hospital Recommended Level of Care: [place] Prior Approval Number:    Date Approved/Denied:   PASRR Number: [id number]  Discharge Plan: Home    Current Diagnoses: Patient Active Problem List   Diagnosis Date Noted  . Status post total replacement of right hip 05/12/2020  . Unilateral primary osteoarthritis, right hip 04/26/2020  . Nonrheumatic aortic valve stenosis 01/21/2020  . Routine general medical examination at a health care facility 06/18/2013  . Post-op stiffness of left TKA 01/25/2013  . Autoimmune disease, not elsewhere classified(279.49) 01/10/2013  . Reactive depression (situational) 01/10/2013  . Elevated BP 01/10/2013  . Thrombocytopenia ([place]) 09/13/2012  . Anemia 09/13/2012  . Lower leg DVT (deep venous thromboembolism), acute ([place]) 09/12/2012  . Patellar dislocation 09/11/2012  . Fever 09/11/2012  . Memory loss 04/13/2012  . Generalized headaches 04/13/2012  . Otalgia 04/13/2012    Orientation RESPIRATION BLADDER Height & Weight     Self, Time, Situation, Place  Normal Continent Weight: 64.4 kg Height:  5\' 7"  (170.2 cm)  BEHAVIORAL SYMPTOMS/MOOD NEUROLOGICAL BOWEL NUTRITION STATUS      Continent Diet  AMBULATORY STATUS COMMUNICATION OF NEEDS Skin   Extensive Assist Verbally Normal                       Personal Care Assistance Level of Assistance  Bathing, Dressing, Total care Bathing Assistance: Maximum assistance   Dressing Assistance: Maximum  assistance Total Care Assistance: Maximum assistance   Functional Limitations Info             SPECIAL CARE FACTORS FREQUENCY  PT (By licensed PT), OT (By licensed OT)     PT Frequency: 5x weekly OT Frequency: 5x weekly            Contractures Contractures Info: Not present    Additional Factors Info  Code Status, Allergies Code Status Info: Full Allergies Info: Methotrexate, dilantin, sulfa           Current Medications (05/15/2020):  This is the current hospital active medication list Current Facility-Administered Medications  Medication Dose Route Frequency Provider Last Rate Last Admin  . 0.9 %  sodium chloride infusion   Intravenous Continuous Mcarthur Rossetti, MD   Stopped at 05/15/20 0830  . acetaminophen (TYLENOL) tablet 325-650 mg  325-650 mg Oral Q6H PRN Mcarthur Rossetti, MD   650 mg at 05/13/20 0950  . alum & mag hydroxide-simeth (MAALOX/MYLANTA) 200-200-20 MG/5ML suspension 30 mL  30 mL Oral Q4H PRN Mcarthur Rossetti, MD      . ascorbic acid (VITAMIN C) tablet 500 mg  500 mg Oral Daily Mcarthur Rossetti, MD   500 mg at 05/15/20 0836  . cholecalciferol (VITAMIN D3) tablet 1,000 Units  1,000 Units Oral Daily Mcarthur Rossetti, MD   1,000 Units at 05/15/20 [phone]  . diphenhydrAMINE (BENADRYL) 12.5 MG/5ML elixir 12.5-25 mg  12.5-25 mg Oral Q4H PRN Mcarthur Rossetti, MD      . docusate sodium (COLACE) capsule 100  mg  100 mg Oral BID Mcarthur Rossetti, MD   100 mg at 05/15/20 0835  . famotidine (PEPCID) tablet 20 mg  20 mg Oral Daily PRN Mcarthur Rossetti, MD      . HYDROcodone-acetaminophen (NORCO/VICODIN) 5-325 MG per tablet 1-2 tablet  1-2 tablet Oral Q6H PRN Mcarthur Rossetti, MD   2 tablet at 05/15/20 0856  . irbesartan (AVAPRO) tablet 150 mg  150 mg Oral Daily Mcarthur Rossetti, MD   150 mg at 05/14/20 0856  . menthol-cetylpyridinium (CEPACOL) lozenge 3 mg  1 lozenge Oral PRN Mcarthur Rossetti, MD        Or  . phenol (CHLORASEPTIC) mouth spray 1 spray  1 spray Mouth/Throat PRN Mcarthur Rossetti, MD      . methocarbamol (ROBAXIN) tablet 500 mg  500 mg Oral Q6H PRN Mcarthur Rossetti, MD   500 mg at 05/15/20 0857   Or  . methocarbamol (ROBAXIN) 500 mg in dextrose 5 % 50 mL IVPB  500 mg Intravenous Q6H PRN Mcarthur Rossetti, MD 100 mL/hr at 05/12/20 1500 500 mg at 05/12/20 1500  . metoCLOPramide (REGLAN) tablet 5-10 mg  5-10 mg Oral Q8H PRN Mcarthur Rossetti, MD       Or  . metoCLOPramide (REGLAN) injection 5-10 mg  5-10 mg Intravenous Q8H PRN Mcarthur Rossetti, MD      . multivitamin with minerals tablet 1 tablet  1 tablet Oral Daily Mcarthur Rossetti, MD   1 tablet at 05/15/20 [phone]  . ondansetron (ZOFRAN) tablet 4 mg  4 mg Oral Q6H PRN Mcarthur Rossetti, MD       Or  . ondansetron [facility]) injection 4 mg  4 mg Intravenous Q6H PRN Mcarthur Rossetti, MD   4 mg at 05/15/20 7341  . polyethylene glycol (MIRALAX / GLYCOLAX) packet 17 g  17 g Oral Daily PRN Mcarthur Rossetti, MD   17 g at 05/14/20 1756  . polyvinyl alcohol (LIQUIFILM TEARS) 1.4 % ophthalmic solution 1 drop  1 drop Both Eyes PRN Mcarthur Rossetti, MD      . predniSONE (DELTASONE) tablet 1 mg  1 mg Oral QAC breakfast Mcarthur Rossetti, MD   1 mg at 05/15/20 0836  . predniSONE (DELTASONE) tablet 5 mg  5 mg Oral QAC breakfast Mcarthur Rossetti, MD   5 mg at 05/15/20 0835  . rivaroxaban (XARELTO) tablet 10 mg  10 mg Oral Daily Mcarthur Rossetti, MD   10 mg at 05/15/20 0835  . traMADol (ULTRAM) tablet 50-100 mg  50-100 mg Oral Q6H PRN Mcarthur Rossetti, MD   50 mg at 05/14/20 2112     Discharge Medications: Please see discharge summary for a list of discharge medications.  Relevant Imaging Results:  Relevant Lab Results:   Additional Information SSN [id number]  Joaquin Courts, RN

## 2020-05-15 NOTE — TOC Progression Note (Signed)
Transition of Care [facility]) - Progression Note    Patient Details  Name: Danielle Villegas MRN: [id number] Date of Birth: 08/27/1956  Transition of Care [facility]) CM/SW Contact  Joaquin Courts, RN Phone Number: 05/15/2020, 1:42 PM  Clinical Narrative:   CM spoke with patient at bedside and provided bed offers.  Patient initialyl selects [facility], however patient is unvaccinated and facility states they do not have any isolation beds available.  Patient was made aware of this and chose [facility] instead, stating that since would need to be in isolation due to lack of vaccine, she is ok with going further outside of [place] since visitation is limited.  [facility] rep states bed is available today and DC summary faxed.  Patient will discharge to room 301 A, PTAR transportation arranged.     Expected Discharge Plan: [place] Barriers to Discharge: No SNF bed  Expected Discharge Plan and Services Expected Discharge Plan: [place]   Discharge Planning Services: CM Consult Post Acute Care Choice: [place] Living arrangements for the past 2 months: Single Family Home Expected Discharge Date: 05/15/20               DME Arranged: N/A DME Agency: NA       HH Arranged: NA HH Agency: NA         Social Determinants of Health (SDOH) Interventions    Readmission Risk Interventions No flowsheet data found.

## 2020-05-15 NOTE — TOC Initial Note (Signed)
Transition of Care [facility]) - Initial/Assessment Note    Patient Details  Name: Danielle Villegas MRN: [id number] Date of Birth: 1956/03/17  Transition [facility]) CM/SW Contact:    Joaquin Courts, RN Phone Number: 05/15/2020, 9:33 AM  Clinical Narrative:    [facility] faxed out to area facilities, waiting for bed offers.                 Expected Discharge Plan: Skilled Nursing Facility Barriers to Discharge: No SNF bed   Patient Goals and CMS Choice Patient states their goals for this hospitalization and ongoing recovery are:: to go to [place] before going back home CMS Medicare.gov Compare Post Acute Care list provided to:: Patient Choice offered to / list presented to : Patient  Expected Discharge Plan and Services Expected Discharge Plan: [place]   Discharge Planning Services: CM Consult Post Acute Care Choice: [place] Living arrangements for the past 2 months: Single Family Home Expected Discharge Date: 05/15/20               DME Arranged: N/A DME Agency: NA       HH Arranged: NA [place] Agency: NA        Prior Living Arrangements/Services Living arrangements for the past 2 months: Single Family Home Lives with:: Self Patient language and need for interpreter reviewed:: Yes Do you feel safe going back to the place where you live?: Yes      Need for Family Participation in Patient Care: No (Comment) Care giver support system in place?: No (comment)   Criminal Activity/Legal Involvement Pertinent to Current Situation/Hospitalization: No - Comment as needed  Activities of Daily Living Home Assistive Devices/Equipment: Environmental consultant (specify type), Cane (specify quad or straight), Eyeglasses ADL Screening (condition at time of admission) Patient's cognitive ability adequate to safely complete daily activities?: Yes Is the patient deaf or have difficulty hearing?: No Does the patient have difficulty seeing, even when wearing  glasses/contacts?: No Does the patient have difficulty concentrating, remembering, or making decisions?: No Patient able to express need for assistance with ADLs?: Yes Does the patient have difficulty dressing or bathing?: No Independently performs ADLs?: Yes (appropriate for developmental age) Does the patient have difficulty walking or climbing stairs?: Yes Weakness of Legs: None Weakness of Arms/Hands: None  Permission Sought/Granted                  Emotional Assessment Appearance:: Appears stated age Attitude/Demeanor/Rapport: Engaged Affect (typically observed): Accepting Orientation: : Oriented to Self, Oriented to Place, Oriented to  Time, Oriented to Situation   Psych Involvement: No (comment)  Admission diagnosis:  Status post total replacement of right hip [Z96.641] Patient Active Problem List   Diagnosis Date Noted  . Status post total replacement of right hip 05/12/2020  . Unilateral primary osteoarthritis, right hip 04/26/2020  . Nonrheumatic aortic valve stenosis 01/21/2020  . Routine general medical examination at a health care facility 06/18/2013  . Post-op stiffness of left TKA 01/25/2013  . Autoimmune disease, not elsewhere classified(279.49) 01/10/2013  . Reactive depression (situational) 01/10/2013  . Elevated BP 01/10/2013  . Thrombocytopenia ([place]) 09/13/2012  . Anemia 09/13/2012  . Lower leg DVT (deep venous thromboembolism), acute ([place]) 09/12/2012  . Patellar dislocation 09/11/2012  . Fever 09/11/2012  . Memory loss 04/13/2012  . Generalized headaches 04/13/2012  . Otalgia 04/13/2012   PCP:  Lawerance Cruel, MD Pharmacy:   [facility] [place]  [place] [address] Phone: [phone] Fax: [phone]  [facility] [address] [place], [place] - [address] [place] [address] Phone: [phone] Fax: [phone]     Social Determinants of  Health (SDOH) Interventions    Readmission Risk Interventions No flowsheet data found.

## 2020-05-15 NOTE — Discharge Summary (Signed)
Patient ID: Danielle Villegas MRN: [id number] DOB/AGE: 1955/12/20 64 y.o.  Admit date: 05/12/2020 Discharge date: 05/15/2020  Admission Diagnoses:  Principal Problem:   Unilateral primary osteoarthritis, right hip Active Problems:   Status post total replacement of right hip   Discharge Diagnoses:  Same  Past Medical History:  Diagnosis Date  . Anemia    hx of   . Anxiety   . Basal cell carcinoma    skin  . DVT (deep venous thrombosis) ([place])    08/2012 in left leg   . Environmental allergies   . Factor 5 Leiden mutation, heterozygous ([place])   . Fibromyalgia   . Heart murmur   . Hypertension   . Lupus ([place])   . Pneumonia    hx of 22 years ago   . RA (rheumatoid arthritis) ([place])   . Seizure ([place])    1997  . Stroke ([place])    1997  . UTI (urinary tract infection)    hx of at age 21     Surgeries: Procedure(s): RIGHT TOTAL HIP ARTHROPLASTY ANTERIOR APPROACH on 05/12/2020   Consultants:   Discharged Condition: Improved  Hospital Course: Danielle Villegas is an 64 y.o. female who was admitted 05/12/2020 for operative treatment ofUnilateral primary osteoarthritis, right hip. Patient has severe unremitting pain that affects sleep, daily activities, and work/hobbies. After pre-op clearance the patient was taken to the operating room on 05/12/2020 and underwent  Procedure(s): RIGHT TOTAL HIP ARTHROPLASTY ANTERIOR APPROACH.    Patient was given perioperative antibiotics:  Anti-infectives (From admission, onward)   Start     Dose/Rate Route Frequency Ordered Stop   05/12/20 1700  ceFAZolin (ANCEF) IVPB 1 g/50 mL premix        1 g 100 mL/hr over 30 Minutes Intravenous Every 6 hours 05/12/20 1424 05/12/20 2253   05/12/20 0830  ceFAZolin (ANCEF) IVPB 2g/100 mL premix        2 g 200 mL/hr over 30 Minutes Intravenous On call to O.R. 05/12/20 4562 05/12/20 1100       Patient was given sequential compression devices, early ambulation, and chemoprophylaxis to prevent  DVT.  Patient benefited maximally from hospital stay and there were no complications.    Recent vital signs:  Patient Vitals for the past 24 hrs:  BP Temp Temp src Pulse Resp SpO2  05/15/20 0528 (!) 146/74 98.3 F (36.8 C) -- 76 16 98 %  05/14/20 2137 (!) 132/53 99.3 F (37.4 C) -- 89 16 99 %  05/14/20 1324 104/63 98.6 F (37 C) Oral 83 12 99 %     Recent laboratory studies:  Recent Labs    05/13/20 0310  WBC 5.8  HGB 11.1*  HCT 34.8*  PLT 130*  NA 132*  K 4.1  CL 102  CO2 25  BUN 18  CREATININE 0.93  GLUCOSE 103*  CALCIUM 8.3*     Discharge Medications:   Allergies as of 05/15/2020      Reactions   Methotrexate Other (See Comments)   Entire body started to shut down, on life support for 2.5 weeks   Dilantin [phenytoin Sodium Extended] Other (See Comments)   Dizziness   Sulfa Antibiotics Other (See Comments)   Body Stiffness      Medication List    TAKE these medications   ascorbic acid 500 MG tablet Commonly known as: VITAMIN C Take 500 mg by mouth daily.   cholecalciferol 1000 units tablet Commonly known as: VITAMIN D Take 1,000 Units by mouth  daily.   famotidine 20 MG tablet Commonly known as: PEPCID Take 20 mg by mouth daily as needed for heartburn or indigestion.   FLONASE ALLERGY RELIEF NA Place 1 spray into both nostrils daily as needed (allergies).   HYDROcodone-acetaminophen 5-325 MG tablet Commonly known as: NORCO/VICODIN Take 1-2 tablets by mouth every 6 (six) hours as needed for severe pain.   ibuprofen 200 MG tablet Commonly known as: ADVIL Take 400 mg by mouth every 6 (six) hours as needed for headache.   irbesartan 150 MG tablet Commonly known as: AVAPRO Take 150 mg by mouth daily.   Lutein 20 MG Tabs Take 20 mg by mouth daily.   Magnesium 250 MG Tabs Take 250 mg by mouth 2 (two) times a week. On Sun and Thurs   meloxicam 15 MG tablet Commonly known as: MOBIC Take 1 tablet (15 mg total) by mouth daily as needed for  pain.   methocarbamol 500 MG tablet Commonly known as: ROBAXIN Take 1 tablet (500 mg total) by mouth every 6 (six) hours as needed for muscle spasms.   Multi-Vitamin tablet Take 1 tablet by mouth daily.   potassium gluconate 595 (99 K) MG Tabs tablet Take 595 mg by mouth 2 (two) times a week. On Tues and Sat   predniSONE 5 MG tablet Commonly known as: DELTASONE TAKE 1 TABLET BY MOUTH EVERY MORNING -TAKE WITH TWO 1MG  TABLETS TO EQUAL DOSE OF 7MG  What changed: See the new instructions.   predniSONE 1 MG tablet Commonly known as: DELTASONE TAKE 2 TABLETS BY MOUTH EVERY DAY (TAKE WITH 5MG  EQUAL TO 7MG ) What changed: See the new instructions.   rivaroxaban 10 MG Tabs tablet Commonly known as: XARELTO Take 1 tablet (10 mg total) by mouth daily.   Systane Complete 0.6 % Soln Generic drug: Propylene Glycol Place 1 drop into both eyes daily as needed (dry eyes).   traMADol 50 MG tablet Commonly known as: ULTRAM Take 1-2 tablets (50-100 mg total) by mouth every 6 (six) hours as needed for moderate pain.   ZyrTEC Allergy 10 MG Caps Generic drug: Cetirizine HCl Take 10 mg by mouth daily.            Durable Medical Equipment  (From admission, onward)         Start     Ordered   05/12/20 1425  DME 3 n 1  Once        05/12/20 1424   05/12/20 1425  DME Walker rolling  Once       Question Answer Comment  Walker: With 5 Inch Wheels   Patient needs a walker to treat with the following condition Status post total replacement of right hip      05/12/20 1424          Diagnostic Studies: DG Pelvis Portable  Result Date: 05/12/2020 CLINICAL DATA:  Status post total hip replacement EXAM: PORTABLE PELVIS 1-2 VIEWS COMPARISON:  Intraoperative right hip radiographs May 12, 2020; pelvis and right hip radiographs October 08, 2017 FINDINGS: Frontal view of lower pelvis and hips obtained. There is a total hip replacement on the right with prosthetic components well-seated on frontal  view. No fracture or dislocation. There is mild narrowing of the left hip joint. There is soft tissue air on the right consistent with recent surgery. IMPRESSION: Status post total hip replacement on the right with prosthetic components well-seated on frontal view. No fracture or dislocation. Mild narrowing left hip joint. Electronically Signed   By: Gwyndolyn Saxon  Jasmine December III M.D.   On: 05/12/2020 13:52   DG C-Arm 1-60 Min-No Report  Result Date: 05/12/2020 Fluoroscopy was utilized by the requesting physician.  No radiographic interpretation.   DG HIP OPERATIVE UNILAT W OR W/O PELVIS RIGHT  Result Date: 05/12/2020 CLINICAL DATA:  Total hip replacement on the right EXAM: OPERATIVE RIGHT HIP   2 VIEWS TECHNIQUE: Fluoroscopic spot image(s) were submitted for interpretation post-operatively. FLUOROSCOPY TIME:  0 minutes 15 seconds; 3 acquired images. COMPARISON:  April 26, 2020. FINDINGS: Frontal and oblique views obtained. Total hip replacement noted on the right with prosthetic components well-seated. No fracture or dislocation. Mild narrowing left hip joint noted. IMPRESSION: Total hip replacement on the right with prosthetic components appearing well-seated. No fracture or dislocation. Electronically Signed   By: Lowella Grip III M.D.   On: 05/12/2020 12:10   XR HIP UNILAT W OR W/O PELVIS 2-3 VIEWS RIGHT  Result Date: 04/26/2020 An AP pelvis and lateral of the right hip shows severe arthritis of the right hip with joint space narrowing as well as para-articular osteophytes and sclerotic changes.  This is worsened when compared to previous x-rays.   Disposition: Discharge disposition: 03-Skilled [place]    Mcarthur Rossetti, MD. Go on 05/25/2020.   Specialty: Orthopedic Surgery Why: at 2:15 pm for your 2 week post-op appointment with our office. Contact information: [address] [place] [address] [phone]                 Signed: Mcarthur Rossetti 05/15/2020, 7:37 AM

## 2020-05-15 NOTE — Progress Notes (Signed)
Subjective: 3 Days Post-Op Procedure(s) (LRB): RIGHT TOTAL HIP ARTHROPLASTY ANTERIOR APPROACH (Right) Patient reports pain as moderate.  Working slowly with therapy.  Short-term SNF  Has been recommended.  Objective: Vital signs in last 24 hours: Temp:  [98.3 F (36.8 C)-99.3 F (37.4 C)] 98.3 F (36.8 C) (08/16 0528) Pulse Rate:  [76-89] 76 (08/16 0528) Resp:  [12-16] 16 (08/16 0528) BP: (104-146)/(53-74) 146/74 (08/16 0528) SpO2:  [98 %-99 %] 98 % (08/16 0528)  Intake/Output from previous day: 08/15 0701 - 08/16 0700 In: 1260.6 [P.O.:750; I.V.:510.6] Out: 450 [Urine:450] Intake/Output this shift: No intake/output data recorded.  Recent Labs    05/13/20 0310  HGB 11.1*   Recent Labs    05/13/20 0310  WBC 5.8  RBC 3.51*  HCT 34.8*  PLT 130*   Recent Labs    05/13/20 0310  NA 132*  K 4.1  CL 102  CO2 25  BUN 18  CREATININE 0.93  GLUCOSE 103*  CALCIUM 8.3*   No results for input(s): LABPT, INR in the last 72 hours.  Sensation intact distally Intact pulses distally Dorsiflexion/Plantar flexion intact Incision: dressing C/D/I No cellulitis present   Assessment/Plan: 3 Days Post-Op Procedure(s) (LRB): RIGHT TOTAL HIP ARTHROPLASTY ANTERIOR APPROACH (Right) Up with therapy Discharge to SNF      Mcarthur Rossetti 05/15/2020, 7:34 AM

## 2020-05-16 ENCOUNTER — Telehealth: Payer: Self-pay | Admitting: *Deleted

## 2020-05-16 ENCOUNTER — Other Ambulatory Visit: Payer: Self-pay | Admitting: *Deleted

## 2020-05-16 NOTE — Patient Outreach (Signed)
Member screened for potential [facility] Care Management needs as a benefit of [place] Medicare.  Per Patient [place] member is residing in [place] [facility].   Communication sent to facility SW to make aware writer is following for potential [facility] Care Management need and transition plans.     Marthenia Rolling, MSN-Ed, RN,BSN [place] Acute Care Coordinator [phone] [facility]) [phone]  (Toll free office)

## 2020-05-16 NOTE — Telephone Encounter (Signed)
Ortho bundle D/C call completed. 

## 2020-05-16 NOTE — Care Plan (Signed)
RNCM call to patient to check status after discharge from hospital yesterday to SNF ([facility]). She indicated she had a very bad day and night. She was not told what facility accepted her or when/where she was going. Also indicated that no one helped her pack her things and some things were left at [facility] that she would like back (small things like crackers, chap stick, posters her daughter made, etc.). CM will reach out to [facility] as well to update them. She indicated she feels like she has gone backwards b/c she was not given therapy yesterday and received none at facilty yesterday either. No pain medication was given because she was not told she had orders and medication Rxs in her bag from staff. CM will update [facility] staff that patient is an Ortho bundle through [facility]. Encouragement given to patient to work hard and to progress so that she can go home as soon as she feels she is able. Will continue to follow along.

## 2020-05-17 ENCOUNTER — Telehealth: Payer: Self-pay

## 2020-05-17 ENCOUNTER — Telehealth: Payer: Self-pay | Admitting: *Deleted

## 2020-05-17 NOTE — Telephone Encounter (Signed)
Patient called in to notify that she is having really bad swelling and still has not seen the doctor at nursing home . Says the swelling is so bad she cant do her PT

## 2020-05-17 NOTE — Telephone Encounter (Signed)
Patient aware to ice and elevate She denies any calf pain or swelling/tenderness She will call me if knee doesn't decrease swelling

## 2020-05-17 NOTE — Telephone Encounter (Signed)
Returned patient's phone call regarding hip replacement. Patient stated,"I had my hip replaced last Friday and I got to [facility] Tuesday. No one here knows what to do with the white stockings. Am I supposed to be wearing them above the knee, below the knee, or at all? The nursing staff said they will do whatever Dr. Marin Olp tells them to do. Also, I have swelling in my leg and my knee. They don't know what to do about the swelling." I informed her that she needs to call the surgeon and inform him. Per Dr. Marin Olp, wear the thigh high stockings. She verbalized understanding.

## 2020-05-18 ENCOUNTER — Other Ambulatory Visit: Payer: Self-pay | Admitting: *Deleted

## 2020-05-18 NOTE — Patient Outreach (Signed)
[facility] Post- Acute Care Coordinator follow up. Member screened for potential [facility] Care Management needs as a benefit of [place] Medicare.  Noted member is in the ortho bundle program. Will not follow for potential [facility] Care Management services after all.   Will make [facility] SW [facility] aware.   Will sign off.   Marthenia Rolling, MSN-Ed, RN,BSN [place] Acute Care Coordinator [phone] [facility]) [phone]  (Toll free office)

## 2020-05-19 ENCOUNTER — Telehealth: Payer: Self-pay | Admitting: *Deleted

## 2020-05-19 NOTE — Care Plan (Signed)
CM contacted [facility] again and spoke with Olivia Mackie as Ebony Hail, SW, was out of office today. Made them aware that patient is an Ortho bundle patient through [facility]. Took CM information regarding contact and requested call back next week to determine d/c planning needs. Olivia Mackie informed she'd give message to [place]. Also attempted to contact patient, but had to leave VM. Will try again on Monday of next week.

## 2020-05-19 NOTE — Telephone Encounter (Signed)
7 day Ortho bundle call attempted. No answer, left VM.

## 2020-05-21 DIAGNOSIS — Z86718 Personal history of other venous thrombosis and embolism: Secondary | ICD-10-CM | POA: Diagnosis not present

## 2020-05-21 DIAGNOSIS — D6851 Activated protein C resistance: Secondary | ICD-10-CM | POA: Diagnosis not present

## 2020-05-21 DIAGNOSIS — M069 Rheumatoid arthritis, unspecified: Secondary | ICD-10-CM | POA: Diagnosis not present

## 2020-05-21 DIAGNOSIS — Z96641 Presence of right artificial hip joint: Secondary | ICD-10-CM | POA: Diagnosis not present

## 2020-05-22 ENCOUNTER — Telehealth: Payer: Self-pay | Admitting: *Deleted

## 2020-05-22 NOTE — Care Plan (Signed)
Ortho bundle call to patient today. Patient did leave VM on office phone over the weekend returning CM's call on Friday. Spoke to her today and she states she is very frustrated as she feels she is not getting adequate PT at [facility]. She states th therapist is not listening to her. She also explained that the main reason she is there is to receive STR for stair climbing and caring for herself before returning home. She has 18 steps to the 2nd floor apartment that she has to navigate to be able to return home. CM attempted to "trouble shoot" some areas that she is concerned about and gave some options regarding therapy, but patient states, "I can't do that, I don't have any arm strength". CM listened to all patients complaints and provided support. Call to [place], SW with [facility] and updated on conversation with patient today and her concerns. Asked if she would speak with Rehab director at [facility] to see if we can work on stair training and push to get patient more function so that she can return to her home ASAP.

## 2020-05-22 NOTE — Telephone Encounter (Signed)
Ortho bundle 7 day call completed. 

## 2020-05-25 ENCOUNTER — Encounter: Payer: Self-pay | Admitting: Orthopaedic Surgery

## 2020-05-25 ENCOUNTER — Ambulatory Visit (INDEPENDENT_AMBULATORY_CARE_PROVIDER_SITE_OTHER): Payer: Medicare Other | Admitting: Orthopaedic Surgery

## 2020-05-25 ENCOUNTER — Telehealth: Payer: Self-pay | Admitting: *Deleted

## 2020-05-25 DIAGNOSIS — Z96641 Presence of right artificial hip joint: Secondary | ICD-10-CM

## 2020-05-25 NOTE — Progress Notes (Signed)
The patient is 2 weeks tomorrow status post a right total hip arthroplasty.  She is only 64 years old but does live alone with her daughter.  Unfortunately, they have been unable to work well with her on stairs at the skilled nursing facility and she has been longer than what we normally keep patient at skilled nursing.  On exam she is ambulate with a walker.  Her hip incision looks good on the right side to remove the staples in place Steri-Strips.  There was swelling to be expected from being on blood thinning medication but no seroma.  Her leg lengths are equal.  At this standpoint hopefully she can transition to home soon.  She will likely need some home PT for a week or 2 after she is home.  All questions and concerns were answered and addressed.  I will see her back myself in 4 weeks to see how she is doing overall but no x-rays are needed.

## 2020-05-25 NOTE — Telephone Encounter (Signed)
14 day Ortho bundle call completed.  

## 2020-05-31 DIAGNOSIS — Z471 Aftercare following joint replacement surgery: Secondary | ICD-10-CM | POA: Diagnosis not present

## 2020-05-31 DIAGNOSIS — D649 Anemia, unspecified: Secondary | ICD-10-CM | POA: Diagnosis not present

## 2020-05-31 DIAGNOSIS — I350 Nonrheumatic aortic (valve) stenosis: Secondary | ICD-10-CM | POA: Diagnosis not present

## 2020-05-31 DIAGNOSIS — Z7952 Long term (current) use of systemic steroids: Secondary | ICD-10-CM | POA: Diagnosis not present

## 2020-05-31 DIAGNOSIS — Z86718 Personal history of other venous thrombosis and embolism: Secondary | ICD-10-CM | POA: Diagnosis not present

## 2020-05-31 DIAGNOSIS — R569 Unspecified convulsions: Secondary | ICD-10-CM | POA: Diagnosis not present

## 2020-05-31 DIAGNOSIS — Z7901 Long term (current) use of anticoagulants: Secondary | ICD-10-CM | POA: Diagnosis not present

## 2020-05-31 DIAGNOSIS — L930 Discoid lupus erythematosus: Secondary | ICD-10-CM | POA: Diagnosis not present

## 2020-05-31 DIAGNOSIS — R413 Other amnesia: Secondary | ICD-10-CM | POA: Diagnosis not present

## 2020-05-31 DIAGNOSIS — Z96641 Presence of right artificial hip joint: Secondary | ICD-10-CM | POA: Diagnosis not present

## 2020-05-31 DIAGNOSIS — M069 Rheumatoid arthritis, unspecified: Secondary | ICD-10-CM | POA: Diagnosis not present

## 2020-05-31 DIAGNOSIS — F329 Major depressive disorder, single episode, unspecified: Secondary | ICD-10-CM | POA: Diagnosis not present

## 2020-05-31 DIAGNOSIS — D696 Thrombocytopenia, unspecified: Secondary | ICD-10-CM | POA: Diagnosis not present

## 2020-05-31 DIAGNOSIS — I10 Essential (primary) hypertension: Secondary | ICD-10-CM | POA: Diagnosis not present

## 2020-05-31 DIAGNOSIS — Z8701 Personal history of pneumonia (recurrent): Secondary | ICD-10-CM | POA: Diagnosis not present

## 2020-05-31 DIAGNOSIS — Z8673 Personal history of transient ischemic attack (TIA), and cerebral infarction without residual deficits: Secondary | ICD-10-CM | POA: Diagnosis not present

## 2020-05-31 DIAGNOSIS — Z85828 Personal history of other malignant neoplasm of skin: Secondary | ICD-10-CM | POA: Diagnosis not present

## 2020-05-31 DIAGNOSIS — M797 Fibromyalgia: Secondary | ICD-10-CM | POA: Diagnosis not present

## 2020-05-31 DIAGNOSIS — F419 Anxiety disorder, unspecified: Secondary | ICD-10-CM | POA: Diagnosis not present

## 2020-05-31 DIAGNOSIS — Z96652 Presence of left artificial knee joint: Secondary | ICD-10-CM | POA: Diagnosis not present

## 2020-06-02 ENCOUNTER — Telehealth: Payer: Self-pay | Admitting: Orthopaedic Surgery

## 2020-06-02 ENCOUNTER — Other Ambulatory Visit: Payer: Self-pay | Admitting: Orthopedic Surgery

## 2020-06-02 DIAGNOSIS — M069 Rheumatoid arthritis, unspecified: Secondary | ICD-10-CM | POA: Diagnosis not present

## 2020-06-02 DIAGNOSIS — Z471 Aftercare following joint replacement surgery: Secondary | ICD-10-CM | POA: Diagnosis not present

## 2020-06-02 DIAGNOSIS — I10 Essential (primary) hypertension: Secondary | ICD-10-CM | POA: Diagnosis not present

## 2020-06-02 DIAGNOSIS — I350 Nonrheumatic aortic (valve) stenosis: Secondary | ICD-10-CM | POA: Diagnosis not present

## 2020-06-02 DIAGNOSIS — L930 Discoid lupus erythematosus: Secondary | ICD-10-CM | POA: Diagnosis not present

## 2020-06-02 DIAGNOSIS — M797 Fibromyalgia: Secondary | ICD-10-CM | POA: Diagnosis not present

## 2020-06-02 MED ORDER — METHOCARBAMOL 500 MG PO TABS: 500.0000 mg | ORAL_TABLET | Freq: Four times a day (QID) | ORAL | 0 refills | Status: AC | PRN

## 2020-06-02 NOTE — Telephone Encounter (Signed)
Patient called.   She is requesting she be prescribed a muscle relaxer  Call back: [phone]

## 2020-06-02 NOTE — Telephone Encounter (Signed)
Rx sent for robaxin. 

## 2020-06-02 NOTE — Telephone Encounter (Signed)
Pt is 2 weeks s/p a right total hip with CB and is asking for a muscle relaxer please advise.

## 2020-06-02 NOTE — Telephone Encounter (Signed)
I called and sw pt to advise that rx has been sent to pharm.

## 2020-06-05 DIAGNOSIS — I350 Nonrheumatic aortic (valve) stenosis: Secondary | ICD-10-CM | POA: Diagnosis not present

## 2020-06-05 DIAGNOSIS — L930 Discoid lupus erythematosus: Secondary | ICD-10-CM | POA: Diagnosis not present

## 2020-06-05 DIAGNOSIS — I10 Essential (primary) hypertension: Secondary | ICD-10-CM | POA: Diagnosis not present

## 2020-06-05 DIAGNOSIS — M797 Fibromyalgia: Secondary | ICD-10-CM | POA: Diagnosis not present

## 2020-06-05 DIAGNOSIS — Z471 Aftercare following joint replacement surgery: Secondary | ICD-10-CM | POA: Diagnosis not present

## 2020-06-05 DIAGNOSIS — M069 Rheumatoid arthritis, unspecified: Secondary | ICD-10-CM | POA: Diagnosis not present

## 2020-06-06 ENCOUNTER — Ambulatory Visit: Payer: Medicare Other | Admitting: Orthopaedic Surgery

## 2020-06-08 DIAGNOSIS — I350 Nonrheumatic aortic (valve) stenosis: Secondary | ICD-10-CM | POA: Diagnosis not present

## 2020-06-08 DIAGNOSIS — L930 Discoid lupus erythematosus: Secondary | ICD-10-CM | POA: Diagnosis not present

## 2020-06-08 DIAGNOSIS — Z471 Aftercare following joint replacement surgery: Secondary | ICD-10-CM | POA: Diagnosis not present

## 2020-06-08 DIAGNOSIS — M797 Fibromyalgia: Secondary | ICD-10-CM | POA: Diagnosis not present

## 2020-06-08 DIAGNOSIS — I10 Essential (primary) hypertension: Secondary | ICD-10-CM | POA: Diagnosis not present

## 2020-06-08 DIAGNOSIS — M069 Rheumatoid arthritis, unspecified: Secondary | ICD-10-CM | POA: Diagnosis not present

## 2020-06-12 ENCOUNTER — Telehealth: Payer: Self-pay | Admitting: *Deleted

## 2020-06-12 NOTE — Telephone Encounter (Signed)
Ortho bundle 30 day call completed. °

## 2020-06-14 DIAGNOSIS — M069 Rheumatoid arthritis, unspecified: Secondary | ICD-10-CM | POA: Diagnosis not present

## 2020-06-14 DIAGNOSIS — Z471 Aftercare following joint replacement surgery: Secondary | ICD-10-CM | POA: Diagnosis not present

## 2020-06-14 DIAGNOSIS — I10 Essential (primary) hypertension: Secondary | ICD-10-CM | POA: Diagnosis not present

## 2020-06-14 DIAGNOSIS — M797 Fibromyalgia: Secondary | ICD-10-CM | POA: Diagnosis not present

## 2020-06-14 DIAGNOSIS — L930 Discoid lupus erythematosus: Secondary | ICD-10-CM | POA: Diagnosis not present

## 2020-06-14 DIAGNOSIS — I350 Nonrheumatic aortic (valve) stenosis: Secondary | ICD-10-CM | POA: Diagnosis not present

## 2020-06-18 DIAGNOSIS — A499 Bacterial infection, unspecified: Secondary | ICD-10-CM | POA: Diagnosis not present

## 2020-06-22 ENCOUNTER — Ambulatory Visit (INDEPENDENT_AMBULATORY_CARE_PROVIDER_SITE_OTHER): Payer: Medicare Other | Admitting: Orthopaedic Surgery

## 2020-06-22 ENCOUNTER — Encounter: Payer: Self-pay | Admitting: Orthopaedic Surgery

## 2020-06-22 DIAGNOSIS — Z96641 Presence of right artificial hip joint: Secondary | ICD-10-CM

## 2020-06-22 MED ORDER — FLUCONAZOLE 150 MG PO TABS: 150.0000 mg | ORAL_TABLET | Freq: Every day | ORAL | 0 refills | Status: AC

## 2020-06-22 NOTE — Progress Notes (Signed)
The patient is now 6 weeks status post a right total hip arthroplasty.  She says the hip is doing well and is pain-free.  She does have some chronic pain in both knees.  She wanted [place] to look at her great toes on both sides.  She went to urgent care recently and was told she had a fungal infection but they did put her on clindamycin.  She states that she may have picked that up from the skilled nursing facility that she stayed at for rehabilitation after hip replacement.  Examination of the great toe on both sides does show I am look to both nails with her great toes.  She is trim 1 back quite far and did cut anterior soft tissue.  I see no evidence of infection.  Her right operative hip is smoothly and fluidly.  Her left hip moves smoothly.  Both knees are quite stiff but no effusion.  She will continue the clindamycin until she runs out the Sunday.  I want her to try one-time dose of Diflucan orally and recommended she at least go to [facility] and get an appointment with the podiatrist to see what they think about her great toe on both sides.  From an orthopedic standpoint, I will see her back in 3 months with a standing Q pelvis and lateral of her right hip.  We can also x-ray both knees at that visit if she would like.  All question concerns were answered and addressed otherwise.

## 2020-06-26 ENCOUNTER — Telehealth: Payer: Self-pay | Admitting: Podiatry

## 2020-06-26 ENCOUNTER — Other Ambulatory Visit: Payer: Self-pay

## 2020-06-26 ENCOUNTER — Ambulatory Visit (INDEPENDENT_AMBULATORY_CARE_PROVIDER_SITE_OTHER): Payer: Medicare Other | Admitting: Podiatry

## 2020-06-26 DIAGNOSIS — S90229A Contusion of unspecified lesser toe(s) with damage to nail, initial encounter: Secondary | ICD-10-CM

## 2020-06-26 DIAGNOSIS — B351 Tinea unguium: Secondary | ICD-10-CM | POA: Diagnosis not present

## 2020-06-26 DIAGNOSIS — L603 Nail dystrophy: Secondary | ICD-10-CM | POA: Diagnosis not present

## 2020-06-26 MED ORDER — CEPHALEXIN 500 MG PO CAPS: 500.0000 mg | ORAL_CAPSULE | Freq: Three times a day (TID) | ORAL | 0 refills | Status: AC

## 2020-06-26 NOTE — Patient Instructions (Signed)

## 2020-06-27 NOTE — Progress Notes (Signed)
Subjective:   Patient ID: Danielle Villegas, female   DOB: 64 y.o.   MRN: [id number]   HPI 64 year old female presents the office today for concerns of her toenails becoming discolored and she was also told she had fungus.  She was recently in rehab after undergoing a total hip replacement.  This was when she first started noticing this.  She has gone to urgent care she was told she had a fungal infection and was given clindamycin.  She is also been on fluconazole.  She presents today for further evaluation.  She states that she has noticed increased darkening to the toenails.  She is currently on Xarelto until next week due to history of factor V.  She follows up with her hematologist next week.   Review of Systems  All other systems reviewed and are negative.  Past Medical History:  Diagnosis Date  . Anemia    hx of   . Anxiety   . Basal cell carcinoma    skin  . DVT (deep venous thrombosis) ([place])    08/2012 in left leg   . Environmental allergies   . Factor 5 Leiden mutation, heterozygous ([place])   . Fibromyalgia   . Heart murmur   . Hypertension   . Lupus ([place])   . Pneumonia    hx of 22 years ago   . RA (rheumatoid arthritis) ([place])   . Seizure ([place])    1997  . Stroke ([place])    1997  . UTI (urinary tract infection)    hx of at age 72     Past Surgical History:  Procedure Laterality Date  . EYE SURGERY     cataract  . ivc filter     . KNEE ARTHROSCOPY     left  and to repair   . KNEE CLOSED REDUCTION Left 01/25/2013   Procedure: CLOSED MANIPULATION KNEE;  Surgeon: Mauri Pole, MD;  Location: [facility];  Service: Orthopedics;  Laterality: Left;  . TONSILLECTOMY AND ADENOIDECTOMY    . TOTAL HIP ARTHROPLASTY Right 05/12/2020   Procedure: RIGHT TOTAL HIP ARTHROPLASTY ANTERIOR APPROACH;  Surgeon: Mcarthur Rossetti, MD;  Location: [facility];  Service: Orthopedics;  Laterality: Right;     Current Outpatient Medications:  .  ascorbic acid (VITAMIN C) 500 MG tablet, Take  500 mg by mouth daily., Disp: , Rfl:  .  cephALEXin (KEFLEX) 500 MG capsule, Take 1 capsule (500 mg total) by mouth 3 (three) times daily., Disp: 21 capsule, Rfl: 0 .  Cetirizine HCl (ZYRTEC ALLERGY) 10 MG CAPS, Take 10 mg by mouth daily. , Disp: , Rfl:  .  cholecalciferol (VITAMIN D) 1000 UNITS tablet, Take 1,000 Units by mouth daily., Disp: , Rfl:  .  famotidine (PEPCID) 20 MG tablet, Take 20 mg by mouth daily as needed for heartburn or indigestion., Disp: , Rfl:  .  fluconazole (DIFLUCAN) 150 MG tablet, Take 1 tablet (150 mg total) by mouth daily., Disp: 1 tablet, Rfl: 0 .  Fluticasone Propionate (FLONASE ALLERGY RELIEF NA), Place 1 spray into both nostrils daily as needed (allergies). , Disp: , Rfl:  .  HYDROcodone-acetaminophen (NORCO/VICODIN) 5-325 MG tablet, Take 1-2 tablets by mouth every 6 (six) hours as needed for severe pain. (Patient not taking: Reported on 06/26/2020), Disp: 30 tablet, Rfl: 0 .  ibuprofen (ADVIL) 200 MG tablet, Take 400 mg by mouth every 6 (six) hours as needed for headache., Disp: , Rfl:  .  irbesartan (AVAPRO) 150 MG tablet, Take  150 mg by mouth daily., Disp: , Rfl:  .  Lutein 20 MG TABS, Take 20 mg by mouth daily., Disp: , Rfl:  .  Magnesium 250 MG TABS, Take 250 mg by mouth 2 (two) times a week. On Sun and Thurs, Disp: , Rfl:  .  meloxicam (MOBIC) 15 MG tablet, Take 1 tablet (15 mg total) by mouth daily as needed for pain. (Patient not taking: Reported on 06/26/2020), Disp: 30 tablet, Rfl: 1 .  methocarbamol (ROBAXIN) 500 MG tablet, Take 1 tablet (500 mg total) by mouth every 6 (six) hours as needed for muscle spasms., Disp: 30 tablet, Rfl: 0 .  Multiple Vitamin (MULTI-VITAMIN) tablet, Take 1 tablet by mouth daily. , Disp: , Rfl:  .  potassium gluconate 595 (99 K) MG TABS tablet, Take 595 mg by mouth 2 (two) times a week. On Tues and Sat, Disp: , Rfl:  .  predniSONE (DELTASONE) 1 MG tablet, TAKE 2 TABLETS BY MOUTH EVERY DAY (TAKE WITH 5MG  EQUAL TO 7MG ) (Patient taking  differently: Take 1 mg by mouth daily. Take with the 5 mg to equal 6 mg daily), Disp: 60 tablet, Rfl: 0 .  predniSONE (DELTASONE) 5 MG tablet, TAKE 1 TABLET BY MOUTH EVERY MORNING -TAKE WITH TWO 1MG  TABLETS TO EQUAL DOSE OF 7MG  (Patient taking differently: Take 5 mg by mouth daily. Take with the 1 mg tablet to equal 6 mg daily), Disp: 30 tablet, Rfl: 0 .  Propylene Glycol (SYSTANE COMPLETE) 0.6 % SOLN, Place 1 drop into both eyes daily as needed (dry eyes)., Disp: , Rfl:  .  rivaroxaban (XARELTO) 10 MG TABS tablet, Take 1 tablet (10 mg total) by mouth daily., Disp: 30 tablet, Rfl: 1 .  traMADol (ULTRAM) 50 MG tablet, Take 1-2 tablets (50-100 mg total) by mouth every 6 (six) hours as needed for moderate pain., Disp: 30 tablet, Rfl: 0  Allergies  Allergen Reactions  . Methotrexate Other (See Comments)    Entire body started to shut down, on life support for 2.5 weeks  . Dilantin [Phenytoin Sodium Extended] Other (See Comments)    Dizziness  . Sulfa Antibiotics Other (See Comments)    Body Stiffness          Objective:  Physical Exam  General: AAO x3, NAD  Dermatological: Bilateral hallux toenails are hypertrophic, dystrophic with yellow-brown discoloration.  Also some dried blood present underneath the toenails.  In the left side the nail is loose with underlying nail bed at the proximal nail fold with edema and erythema.  There is no drainage or pus or ascending cellulitis but there is no fluctuation crepitation there is no malodor.  Vascular: Dorsalis Pedis artery and Posterior Tibial artery pedal pulses are 2/4 bilateral with immedate capillary fill time.There is no pain with calf compression, swelling, warmth, erythema.   Neruologic: Grossly intact via light touch bilateral.   Musculoskeletal: Minimal discomfort to the nail bed.  Gait: Unassisted, Nonantalgic.       Assessment:   Bilateral hallux onychodystrophy, cephalhematoma with onycholysis left hallux    Plan:   -Treatment options discussed including all alternatives, risks, and complications -Etiology of symptoms were discussed -As point I recommended total nail removal of the left hallux toenail.  She is on Xarelto and she comes office next week.  She is to follow with her hematologist, see her back after that 2 days after stopping the blood thinner for nail removal.  Have her monitor for any signs or symptoms of worsened infection prior  to that and let me know if any were to occur.  In the meantime I want her to do Epson salt soaks daily and antibiotic ointment.  Prescribed Keflex. -Remove the nail on the left side we will send this for culture, pathology.  Trula Slade DPM

## 2020-06-28 ENCOUNTER — Telehealth: Payer: Self-pay

## 2020-06-28 ENCOUNTER — Telehealth: Payer: Self-pay | Admitting: Podiatry

## 2020-06-28 NOTE — Telephone Encounter (Signed)
Can you schedule her for Monday at 4:30 or 7:45 for nail removal. I would like her off of the blood thinners for 2 days.

## 2020-06-28 NOTE — Telephone Encounter (Signed)
Pt wanted to know if Dr. Jacqualyn Posey talked to her hematologist about getting her scheduled for an ingrown toenail. She is currently on blood thinners. Please call patient back.

## 2020-06-28 NOTE — Telephone Encounter (Signed)
Patient called stating she needs to have a toenail removed and can getit done Oct 1st but is still on Xarelto and is requesting if she can go off it early. Per Dr.Ennever patient can stop Xarelto and keep appt for 07/05/2020 for follow up. Called and informed patient who verbalized understanding.

## 2020-06-28 NOTE — Telephone Encounter (Signed)
Pt said she is off her blood thinners as of tomorrow a.m. and she said you wanted to take her nail off asap because you were worried of infection. I have her scheduled for 8:15 on 10/7. She is really worried about it and wants to come in sooner but you have nothing opened. Please let me know if you want me to get her in sooner and where I could put her.

## 2020-06-30 DIAGNOSIS — Z471 Aftercare following joint replacement surgery: Secondary | ICD-10-CM | POA: Diagnosis not present

## 2020-07-03 ENCOUNTER — Other Ambulatory Visit: Payer: Self-pay

## 2020-07-03 ENCOUNTER — Ambulatory Visit (INDEPENDENT_AMBULATORY_CARE_PROVIDER_SITE_OTHER): Payer: Medicare Other | Admitting: Podiatry

## 2020-07-03 ENCOUNTER — Ambulatory Visit: Payer: Medicare Other | Admitting: Podiatry

## 2020-07-03 DIAGNOSIS — L603 Nail dystrophy: Secondary | ICD-10-CM

## 2020-07-03 DIAGNOSIS — L608 Other nail disorders: Secondary | ICD-10-CM | POA: Diagnosis not present

## 2020-07-03 DIAGNOSIS — B351 Tinea unguium: Secondary | ICD-10-CM

## 2020-07-03 DIAGNOSIS — S90221D Contusion of right lesser toe(s) with damage to nail, subsequent encounter: Secondary | ICD-10-CM

## 2020-07-03 MED ORDER — MUPIROCIN 2 % EX OINT: 1.0000 "application " | TOPICAL_OINTMENT | Freq: Two times a day (BID) | CUTANEOUS | 2 refills | Status: AC

## 2020-07-03 NOTE — Patient Instructions (Signed)

## 2020-07-05 ENCOUNTER — Other Ambulatory Visit: Payer: Self-pay

## 2020-07-05 ENCOUNTER — Inpatient Hospital Stay (HOSPITAL_BASED_OUTPATIENT_CLINIC_OR_DEPARTMENT_OTHER): Payer: Medicare Other | Admitting: Hematology & Oncology

## 2020-07-05 ENCOUNTER — Inpatient Hospital Stay: Payer: Medicare Other | Attending: Family

## 2020-07-05 ENCOUNTER — Encounter: Payer: Self-pay | Admitting: Hematology & Oncology

## 2020-07-05 VITALS — BP 133/86 | HR 100 | Temp 98.5°F | Resp 18 | Wt 138.0 lb

## 2020-07-05 DIAGNOSIS — C4490 Unspecified malignant neoplasm of skin, unspecified: Secondary | ICD-10-CM | POA: Diagnosis not present

## 2020-07-05 DIAGNOSIS — D6851 Activated protein C resistance: Secondary | ICD-10-CM | POA: Insufficient documentation

## 2020-07-05 DIAGNOSIS — Z7982 Long term (current) use of aspirin: Secondary | ICD-10-CM | POA: Diagnosis not present

## 2020-07-05 DIAGNOSIS — I82442 Acute embolism and thrombosis of left tibial vein: Secondary | ICD-10-CM | POA: Diagnosis not present

## 2020-07-05 DIAGNOSIS — Z96641 Presence of right artificial hip joint: Secondary | ICD-10-CM | POA: Diagnosis not present

## 2020-07-05 DIAGNOSIS — Z95828 Presence of other vascular implants and grafts: Secondary | ICD-10-CM | POA: Insufficient documentation

## 2020-07-05 DIAGNOSIS — I824Z9 Acute embolism and thrombosis of unspecified deep veins of unspecified distal lower extremity: Secondary | ICD-10-CM

## 2020-07-05 LAB — CBC WITH DIFFERENTIAL (CANCER CENTER ONLY)
Abs Immature Granulocytes: 0.03 10*3/uL (ref 0.00–0.07)
Basophils Absolute: 0 10*3/uL (ref 0.0–0.1)
Basophils Relative: 0 %
Eosinophils Absolute: 0 10*3/uL (ref 0.0–0.5)
Eosinophils Relative: 0 %
HCT: 39.1 % (ref 36.0–46.0)
Hemoglobin: 12.4 g/dL (ref 12.0–15.0)
Immature Granulocytes: 1 %
Lymphocytes Relative: 8 %
Lymphs Abs: 0.5 10*3/uL — ABNORMAL LOW (ref 0.7–4.0)
MCH: 30.9 pg (ref 26.0–34.0)
MCHC: 31.7 g/dL (ref 30.0–36.0)
MCV: 97.5 fL (ref 80.0–100.0)
Monocytes Absolute: 0.5 10*3/uL (ref 0.1–1.0)
Monocytes Relative: 8 %
Neutro Abs: 5.4 10*3/uL (ref 1.7–7.7)
Neutrophils Relative %: 83 %
Platelet Count: 233 10*3/uL (ref 150–400)
RBC: 4.01 MIL/uL (ref 3.87–5.11)
RDW: 14.7 % (ref 11.5–15.5)
WBC Count: 6.5 10*3/uL (ref 4.0–10.5)
nRBC: 0 % (ref 0.0–0.2)

## 2020-07-05 LAB — CMP (CANCER CENTER ONLY)
ALT: 11 U/L (ref 0–44)
AST: 17 U/L (ref 15–41)
Albumin: 4.2 g/dL (ref 3.5–5.0)
Alkaline Phosphatase: 77 U/L (ref 38–126)
Anion gap: 8 (ref 5–15)
BUN: 21 mg/dL (ref 8–23)
CO2: 31 mmol/L (ref 22–32)
Calcium: 10.5 mg/dL — ABNORMAL HIGH (ref 8.9–10.3)
Chloride: 104 mmol/L (ref 98–111)
Creatinine: 0.95 mg/dL (ref 0.44–1.00)
GFR, Estimated: >60 mL/min (ref >60)
Glucose, Bld: 108 mg/dL — ABNORMAL HIGH (ref 70–99)
Potassium: 4.1 mmol/L (ref 3.5–5.1)
Sodium: 143 mmol/L (ref 135–145)
Total Bilirubin: 0.9 mg/dL (ref 0.3–1.2)
Total Protein: 6.9 g/dL (ref 6.5–8.1)

## 2020-07-05 LAB — D-DIMER, QUANTITATIVE: D-Dimer, Quant: 1.63 ug/mL-FEU — ABNORMAL HIGH (ref 0.00–0.50)

## 2020-07-05 NOTE — Progress Notes (Signed)
Hematology and Oncology Follow Up Visit  Danielle Villegas [id number] November 25, 1955 64 y.o. 07/05/2020   Principle Diagnosis:  Factor V Leiden mutation - heterozygote Deep venous thrombosis of the left posterior tibial vein Iron deficiency anemia   Current Therapy:   IVC filter Aspirin 162 mg PO daily IV iron as indicated    Interim History:  Danielle Villegas is here today for follow-up.  She looks amazing.  She had a quick recovery from her right hip surgery.  She had hip surgery on August 13.  She then went to a rehab center.  The rehab center stay was quite challenging.  She was there for a couple weeks.  I am not sure why she was there for so long.  Anyway, she has done incredibly well.  I think she was on Xarelto.  She was on Xarelto for about 6 weeks.  She now is back on baby aspirin.  She is doing well.  She had no problems with bleeding.  She did have an issue with a toenail but this seemed to resolve itself.  She has had no problems with cough or shortness of breath.  She has had no nausea or vomiting.  There has been no change in bowel or bladder habits.  She has had no fever.  Overall, her performance status is ECOG 0.    Medications:  Allergies as of 07/05/2020      Reactions   Methotrexate Other (See Comments)   Entire body started to shut down, on life support for 2.5 weeks   Dilantin [phenytoin Sodium Extended] Other (See Comments)   Dizziness   Sulfa Antibiotics Other (See Comments)   Body Stiffness      Medication List       Accurate as of July 05, 2020  2:58 PM. If you have any questions, ask your nurse or doctor.        ascorbic acid 500 MG tablet Commonly known as: VITAMIN C Take 500 mg by mouth daily.   cephALEXin 500 MG capsule Commonly known as: KEFLEX Take 1 capsule (500 mg total) by mouth 3 (three) times daily.   cholecalciferol 1000 units tablet Commonly known as: VITAMIN D Take 1,000 Units by mouth daily.   famotidine 20 MG  tablet Commonly known as: PEPCID Take 20 mg by mouth daily as needed for heartburn or indigestion.   FLONASE ALLERGY RELIEF NA Place 1 spray into both nostrils daily as needed (allergies).   fluconazole 150 MG tablet Commonly known as: Diflucan Take 1 tablet (150 mg total) by mouth daily.   HYDROcodone-acetaminophen 5-325 MG tablet Commonly known as: NORCO/VICODIN Take 1-2 tablets by mouth every 6 (six) hours as needed for severe pain.   ibuprofen 200 MG tablet Commonly known as: ADVIL Take 400 mg by mouth every 6 (six) hours as needed for headache.   irbesartan 150 MG tablet Commonly known as: AVAPRO Take 150 mg by mouth daily.   Lutein 20 MG Tabs Take 20 mg by mouth daily.   Magnesium 250 MG Tabs Take 250 mg by mouth 2 (two) times a week. On Sun and Thurs   meloxicam 15 MG tablet Commonly known as: MOBIC Take 1 tablet (15 mg total) by mouth daily as needed for pain.   methocarbamol 500 MG tablet Commonly known as: ROBAXIN Take 1 tablet (500 mg total) by mouth every 6 (six) hours as needed for muscle spasms.   Multi-Vitamin tablet Take 1 tablet by mouth daily.   mupirocin ointment 2 %  Commonly known as: BACTROBAN Apply 1 application topically 2 (two) times daily.   potassium gluconate 595 (99 K) MG Tabs tablet Take 595 mg by mouth 2 (two) times a week. On Tues and Sat   predniSONE 5 MG tablet Commonly known as: DELTASONE TAKE 1 TABLET BY MOUTH EVERY MORNING -TAKE WITH TWO 1MG  TABLETS TO EQUAL DOSE OF 7MG  What changed: See the new instructions.   predniSONE 1 MG tablet Commonly known as: DELTASONE TAKE 2 TABLETS BY MOUTH EVERY DAY (TAKE WITH 5MG  EQUAL TO 7MG ) What changed: See the new instructions.   rivaroxaban 10 MG Tabs tablet Commonly known as: XARELTO Take 1 tablet (10 mg total) by mouth daily.   Systane Complete 0.6 % Soln Generic drug: Propylene Glycol Place 1 drop into both eyes daily as needed (dry eyes).   traMADol 50 MG tablet Commonly  known as: ULTRAM Take 1-2 tablets (50-100 mg total) by mouth every 6 (six) hours as needed for moderate pain.   ZyrTEC Allergy 10 MG Caps Generic drug: Cetirizine HCl Take 10 mg by mouth daily.       Allergies:  Allergies  Allergen Reactions  . Methotrexate Other (See Comments)    Entire body started to shut down, on life support for 2.5 weeks  . Dilantin [Phenytoin Sodium Extended] Other (See Comments)    Dizziness  . Sulfa Antibiotics Other (See Comments)    Body Stiffness    Past Medical History, Surgical history, Social history, and Family History were reviewed and updated.  Review of Systems: Review of Systems  Constitutional: Negative.   HENT: Negative.   Eyes: Negative.   Respiratory: Negative.  Negative for cough.   Cardiovascular: Negative.   Gastrointestinal: Negative.   Genitourinary: Negative.   Musculoskeletal: Negative.   Skin: Negative.   Neurological: Negative.   Endo/Heme/Allergies: Negative.   Psychiatric/Behavioral: Negative.      Physical Exam:  weight is 138 lb (62.6 kg). Her oral temperature is 98.5 F (36.9 C). Her blood pressure is 133/86 and her pulse is 100. Her respiration is 18 and oxygen saturation is 100%.   Wt Readings from Last 3 Encounters:  07/05/20 138 lb (62.6 kg)  05/12/20 141 lb 15.6 oz (64.4 kg)  05/09/20 142 lb (64.4 kg)    Physical Exam Vitals reviewed.  HENT:     Head: Normocephalic and atraumatic.  Eyes:     Pupils: Pupils are equal, round, and reactive to light.  Cardiovascular:     Rate and Rhythm: Normal rate and regular rhythm.     Heart sounds: Normal heart sounds.  Pulmonary:     Effort: Pulmonary effort is normal.     Breath sounds: Normal breath sounds.  Abdominal:     General: Bowel sounds are normal.     Palpations: Abdomen is soft.  Musculoskeletal:        General: No tenderness or deformity. Normal range of motion.     Cervical back: Normal range of motion.     Comments: Extremities shows the  right hip surgery.  This is healed.  She has good range of motion of her right hip.  Lymphadenopathy:     Cervical: No cervical adenopathy.  Skin:    General: Skin is warm and dry.     Findings: No erythema or rash.  Neurological:     Mental Status: She is alert and oriented to person, place, and time.  Psychiatric:        Behavior: Behavior normal.  Thought Content: Thought content normal.        Judgment: Judgment normal.      Lab Results  Component Value Date   WBC 6.5 07/05/2020   HGB 12.4 07/05/2020   HCT 39.1 07/05/2020   MCV 97.5 07/05/2020   PLT 233 07/05/2020   Lab Results  Component Value Date   FERRITIN 199 09/24/2013   IRON 68 09/24/2013   TIBC 308 09/24/2013   UIBC 240 09/24/2013   IRONPCTSAT 22 09/24/2013   Lab Results  Component Value Date   RETICCTPCT 1.3 09/24/2013   RBC 4.01 07/05/2020   RETICCTABS 52.8 09/24/2013   No results found for: KPAFRELGTCHN, LAMBDASER, KAPLAMBRATIO No results found for: IGGSERUM, IGA, IGMSERUM No results found for: Odetta Pink, SPEI   Chemistry      Component Value Date/Time   NA 143 07/05/2020 1349   K 4.1 07/05/2020 1349   CL 104 07/05/2020 1349   CO2 31 07/05/2020 1349   BUN 21 07/05/2020 1349   CREATININE 0.95 07/05/2020 1349      Component Value Date/Time   CALCIUM 10.5 (H) 07/05/2020 1349   ALKPHOS 77 07/05/2020 1349   AST 17 07/05/2020 1349   ALT 11 07/05/2020 1349   BILITOT 0.9 07/05/2020 1349       Impression and Plan: Danielle Villegas is a very pleasant 64 yo caucasian female with factor V Leiden mutation - heterozygous and DVT of the left posterior tibial vein.   She has done well on 2 baby aspirin daily and has her IVC filter in place.   I am so glad that she got through her hip surgery without any issues.  For now, I do not think we have to get her back to the office.  She is a let us know if she needs to have any intervention done that  needs to require a change in her anticoagulation.  I am just so happy that she has done well.  She is not in pain.  She was having a lot of years of suffering because of the hip.  She is not very grateful for having the hip replaced and can live a much more comfortable and active lifestyle.     Volanda Napoleon, MD 10/6/20212:58 PM

## 2020-07-06 ENCOUNTER — Telehealth: Payer: Self-pay | Admitting: Hematology & Oncology

## 2020-07-06 ENCOUNTER — Ambulatory Visit: Payer: Medicare Other | Admitting: Podiatry

## 2020-07-06 ENCOUNTER — Telehealth: Payer: Self-pay | Admitting: *Deleted

## 2020-07-06 DIAGNOSIS — D6851 Activated protein C resistance: Secondary | ICD-10-CM

## 2020-07-06 DIAGNOSIS — I824Z9 Acute embolism and thrombosis of unspecified deep veins of unspecified distal lower extremity: Secondary | ICD-10-CM

## 2020-07-06 MED ORDER — ASPIRIN EC 81 MG PO TBEC: 162.0000 mg | DELAYED_RELEASE_TABLET | Freq: Every day | ORAL | 0 refills | Status: AC

## 2020-07-06 NOTE — Telephone Encounter (Signed)
Message received from patient wanting to know if she should start two baby aspirin back daily now that Xarelto has ended.  Call placed back to patient and patient notified per order of Dr. Marin Olp to restart two baby aspirin daily.  Pt appreciative of call and has no further questions at this time.

## 2020-07-06 NOTE — Telephone Encounter (Signed)
No los 10/6

## 2020-07-11 NOTE — Progress Notes (Signed)
Subjective: 64 year old female presents the office today for possible nail removed from left hallux.  She was started on Keflex last appointment which she had to stop after 5 days due to side effects.  She has been soaking Epson salts but overall she thinks the toe is looking much better than it did when I last saw her.  She presents today for further evaluation. Denies any systemic complaints such as fevers, chills, nausea, vomiting. No acute changes since last appointment, and no other complaints at this time.   Objective: AAO x3, NAD DP/PT pulses palpable bilaterally, CRT less than 3 seconds Bilateral hallux nails are hypertrophic, dystrophic yellow-brown discoloration.  There is dried blood present underneath the toenail on the left side worse than right.  There is no edema, erythema, drainage or pus or any signs of infection noted today.  The nail partially attached to the nailbed.  Most notably on the proximal nail fold the left side where it is loose and had fluid this is resolved.  No pain with calf compression, swelling, warmth, erythema  Assessment: Onychomycosis, subungual hematoma  Plan: -All treatment options discussed with the patient including all alternatives, risks, complications.  -At this time there is no signs of infection the nail seems to be attached.  Because of this we decided to hold off on removal of the nail today.  I did sharply debride the nail I sent this to [facility] for evaluation of nail fungus.  I would continue soaking Epson salt soaks cover with a small amount of antibiotic ointment daily.  If there is any reoccurrence she will likely need to have the nail removed will monitor closely. -Patient encouraged to call the office with any questions, concerns, change in symptoms.   Trula Slade DPM

## 2020-07-18 DIAGNOSIS — Z23 Encounter for immunization: Secondary | ICD-10-CM | POA: Diagnosis not present

## 2020-07-19 ENCOUNTER — Telehealth: Payer: Self-pay | Admitting: Podiatry

## 2020-07-19 NOTE — Telephone Encounter (Signed)
Pt had questions about her toenail clippings. She stated she hasn't heard anything about her results.

## 2020-07-19 NOTE — Telephone Encounter (Signed)
I called patient to go over results- she is aware

## 2020-07-19 NOTE — Telephone Encounter (Signed)
Ok thank you 

## 2020-07-24 ENCOUNTER — Encounter: Payer: Self-pay | Admitting: Orthopaedic Surgery

## 2020-07-24 ENCOUNTER — Ambulatory Visit (INDEPENDENT_AMBULATORY_CARE_PROVIDER_SITE_OTHER): Payer: Medicare Other | Admitting: Orthopaedic Surgery

## 2020-07-24 DIAGNOSIS — Z96641 Presence of right artificial hip joint: Secondary | ICD-10-CM

## 2020-07-24 NOTE — Progress Notes (Signed)
The patient is just over 2 months status post a right total hip arthroplasty.  She was concerned a little bit about the numbness and tingling that she is feeling and some of the pain around her right hip as well as swelling.  She is walking without assistive device and says she is really doing well but she did not have this pain immediately after surgery.  On examination her right hip went smoothly and fluidly.  Her incisions healed nicely.  The swelling that she is experiencing is to be expected postoperative.  There is no hematoma or seroma.  There is some subjective numbness around the incision that is also to be expected.  The hip moves well.  I gave her reassurance that this is normal postoperatively.  I would like to see her back in 2 months.  At that visit I like a standing low AP pelvis and lateral of her right operative hip.

## 2020-08-03 ENCOUNTER — Other Ambulatory Visit: Payer: Self-pay

## 2020-08-03 ENCOUNTER — Ambulatory Visit (INDEPENDENT_AMBULATORY_CARE_PROVIDER_SITE_OTHER): Payer: Medicare Other | Admitting: Podiatry

## 2020-08-03 DIAGNOSIS — L603 Nail dystrophy: Secondary | ICD-10-CM

## 2020-08-03 DIAGNOSIS — B351 Tinea unguium: Secondary | ICD-10-CM | POA: Diagnosis not present

## 2020-08-07 DIAGNOSIS — N644 Mastodynia: Secondary | ICD-10-CM | POA: Diagnosis not present

## 2020-08-07 DIAGNOSIS — Z01419 Encounter for gynecological examination (general) (routine) without abnormal findings: Secondary | ICD-10-CM | POA: Diagnosis not present

## 2020-08-08 ENCOUNTER — Other Ambulatory Visit: Payer: Self-pay | Admitting: Obstetrics and Gynecology

## 2020-08-08 DIAGNOSIS — N644 Mastodynia: Secondary | ICD-10-CM

## 2020-08-09 NOTE — Progress Notes (Signed)
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  She said the left nail came off on its own and is not painful and she had no bleeding or drainage.  She states that the right side of started to come off and she expected to come off next couple days.  Denies any drainage or pus in the right side as well she has no other concerns. Denies any systemic complaints such as fevers, chills, nausea, vomiting. No acute changes since last appointment, and no other complaints at this time.   Objective: AAO x3, NAD DP/PT pulses palpable bilaterally, CRT less than 3 seconds On the right side is lucent my nail bed only attached proximally.  There is no edema, erythema.  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  -The left hallux toenail has come off on its own.  Continue soaking Epson salts and hopefully the right side will come off on its own as well. Monitor for any signs or symptoms of infection.  Trula Slade DPM

## 2020-08-18 ENCOUNTER — Telehealth: Payer: Self-pay | Admitting: *Deleted

## 2020-08-18 NOTE — Telephone Encounter (Signed)
Ortho bundle 90 day call completed. 

## 2020-09-06 ENCOUNTER — Ambulatory Visit
Admission: RE | Admit: 2020-09-06 | Discharge: 2020-09-06 | Disposition: A | Discharge status: Home / Self Care | Payer: Medicare Other | Source: Ambulatory Visit | Attending: Obstetrics and Gynecology | Admitting: Obstetrics and Gynecology

## 2020-09-06 ENCOUNTER — Other Ambulatory Visit: Payer: Self-pay

## 2020-09-06 DIAGNOSIS — N644 Mastodynia: Secondary | ICD-10-CM

## 2020-09-12 ENCOUNTER — Ambulatory Visit (INDEPENDENT_AMBULATORY_CARE_PROVIDER_SITE_OTHER): Payer: Medicare Other | Admitting: Podiatry

## 2020-09-12 ENCOUNTER — Other Ambulatory Visit: Payer: Self-pay

## 2020-09-12 DIAGNOSIS — B351 Tinea unguium: Secondary | ICD-10-CM

## 2020-09-12 DIAGNOSIS — S90221D Contusion of right lesser toe(s) with damage to nail, subsequent encounter: Secondary | ICD-10-CM | POA: Diagnosis not present

## 2020-09-12 DIAGNOSIS — L603 Nail dystrophy: Secondary | ICD-10-CM

## 2020-09-14 ENCOUNTER — Ambulatory Visit: Payer: Medicare Other | Admitting: Orthopaedic Surgery

## 2020-09-14 NOTE — Progress Notes (Signed)
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  She states that she has been doing well but she is to be moving to [place] next month and she just wants to have the toenails checked.  She currently denies any pain, redness or drainage or any signs of infection. Denies any systemic complaints such as fevers, chills, nausea, vomiting. No acute changes since last appointment, and no other complaints at this time.   Objective: AAO x3, NAD DP/PT pulses palpable bilaterally, CRT less than 3 seconds The nails are starting to grow back and.  There is no pain the nails there is no redness or drainage or any signs of infection.  There is a very small area of dried blood underneath the toenail border but there is no active drainage or pus or any edema, erythema or any signs or symptoms of infection. 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  -The toenails that started to grow back.  There is no pain or signs of infection.  We will continue to monitor.  We will have to monitor the nails and comes back in if they were to come in thick or discolored well to treatment for this.  Trula Slade DPM

## 2020-09-18 DIAGNOSIS — J45991 Cough variant asthma: Secondary | ICD-10-CM | POA: Diagnosis not present

## 2020-09-25 ENCOUNTER — Ambulatory Visit (INDEPENDENT_AMBULATORY_CARE_PROVIDER_SITE_OTHER): Payer: Medicare Other

## 2020-09-25 ENCOUNTER — Encounter: Payer: Self-pay | Admitting: Orthopaedic Surgery

## 2020-09-25 ENCOUNTER — Ambulatory Visit (INDEPENDENT_AMBULATORY_CARE_PROVIDER_SITE_OTHER): Payer: Medicare Other | Admitting: Orthopaedic Surgery

## 2020-09-25 DIAGNOSIS — Z96641 Presence of right artificial hip joint: Secondary | ICD-10-CM

## 2020-09-25 NOTE — Progress Notes (Signed)
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  She reports that she is doing well and has some pain occasionally but not terrible.  She says is better than it was with the pain she was dealing with before surgery.  This is also helped her posture and decrease her back pain.  She does report to me that she is moving permanently to [place] in 3 weeks.  Examination right hip shows it moves smoothly and fluidly.  She walks without a limp.  X-rays of the pelvis and right hip show well-seated implant with no complicating feature.  Her left hip appears normal.  On my standpoint she can follow-up as needed.  If she has any issues at all she should not hesitate to give [place] a call.

## 2020-10-23 DIAGNOSIS — Z20822 Contact with and (suspected) exposure to covid-19: Secondary | ICD-10-CM | POA: Diagnosis not present

## 2021-02-18 IMAGING — RF DG HIP (WITH PELVIS) OPERATIVE*R*
1 series · 3 of 3 positions shown · non-contrast
Comparison: April 26, 2020.

CLINICAL DATA: Total hip replacement on the right

EXAM:
OPERATIVE RIGHT HIP   2 VIEWS
TECHNIQUE: Fluoroscopic spot image(s) were submitted for interpretation
post-operatively.
FLUOROSCOPY TIME:  0 minutes 15 seconds; 3 acquired images.

[Series 1: unknown protocol · 0.20mm/px · 3 of 3 slices shown]
[im 1/3]
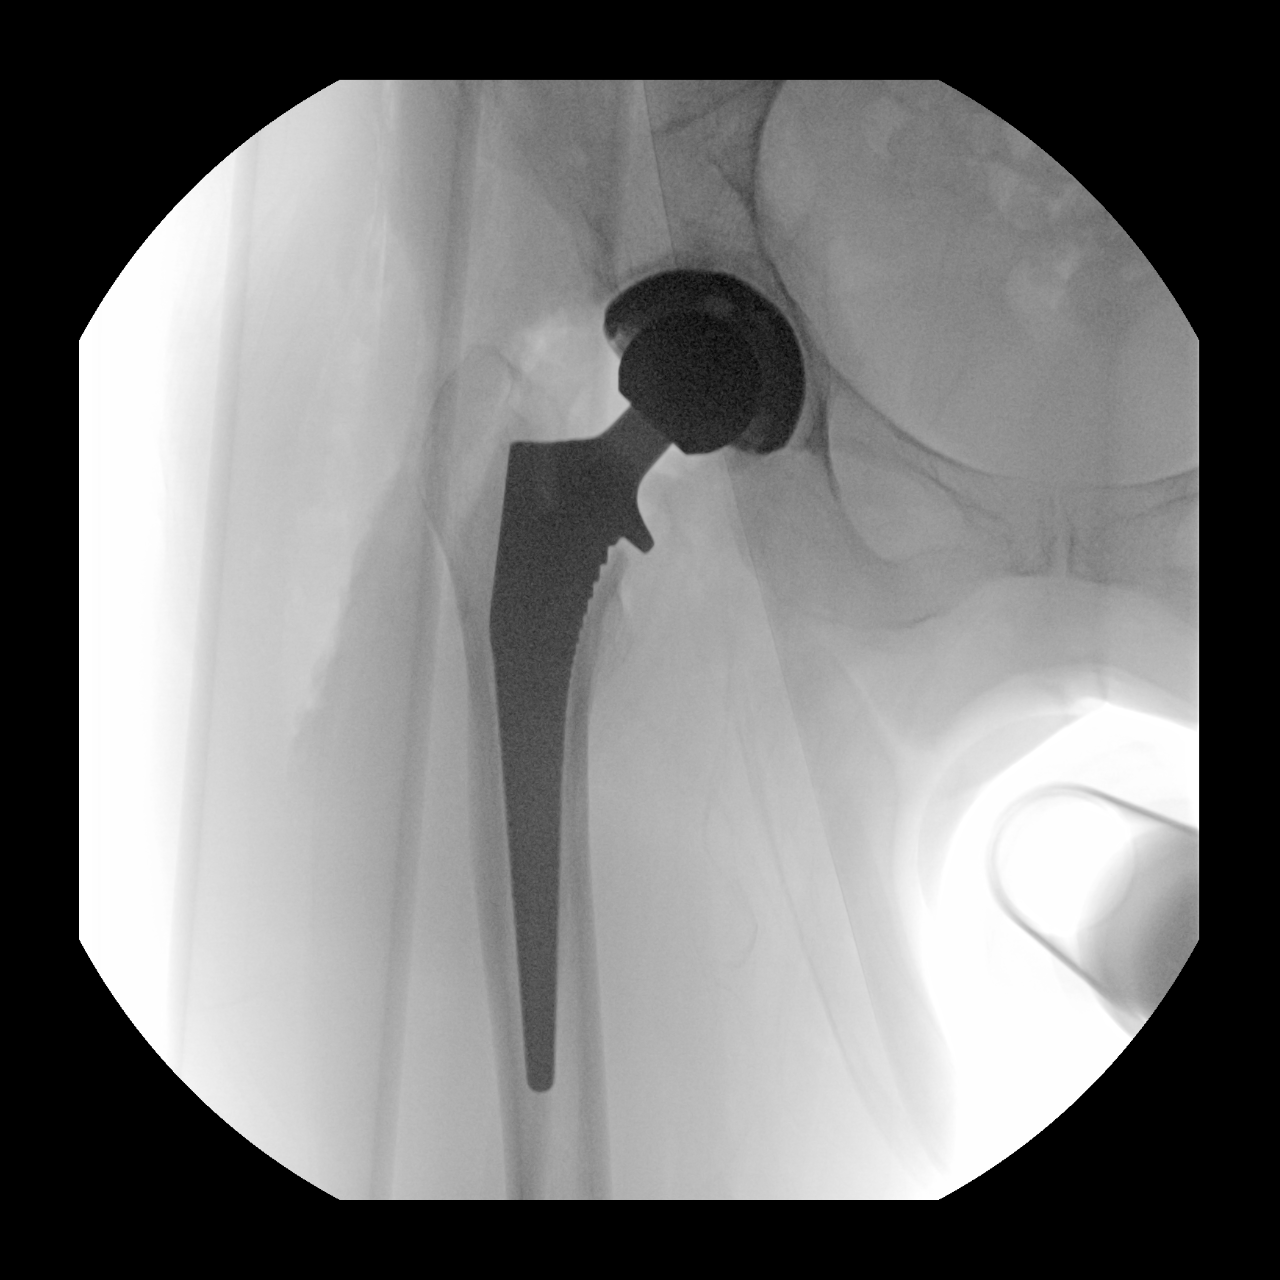
[im 2/3]
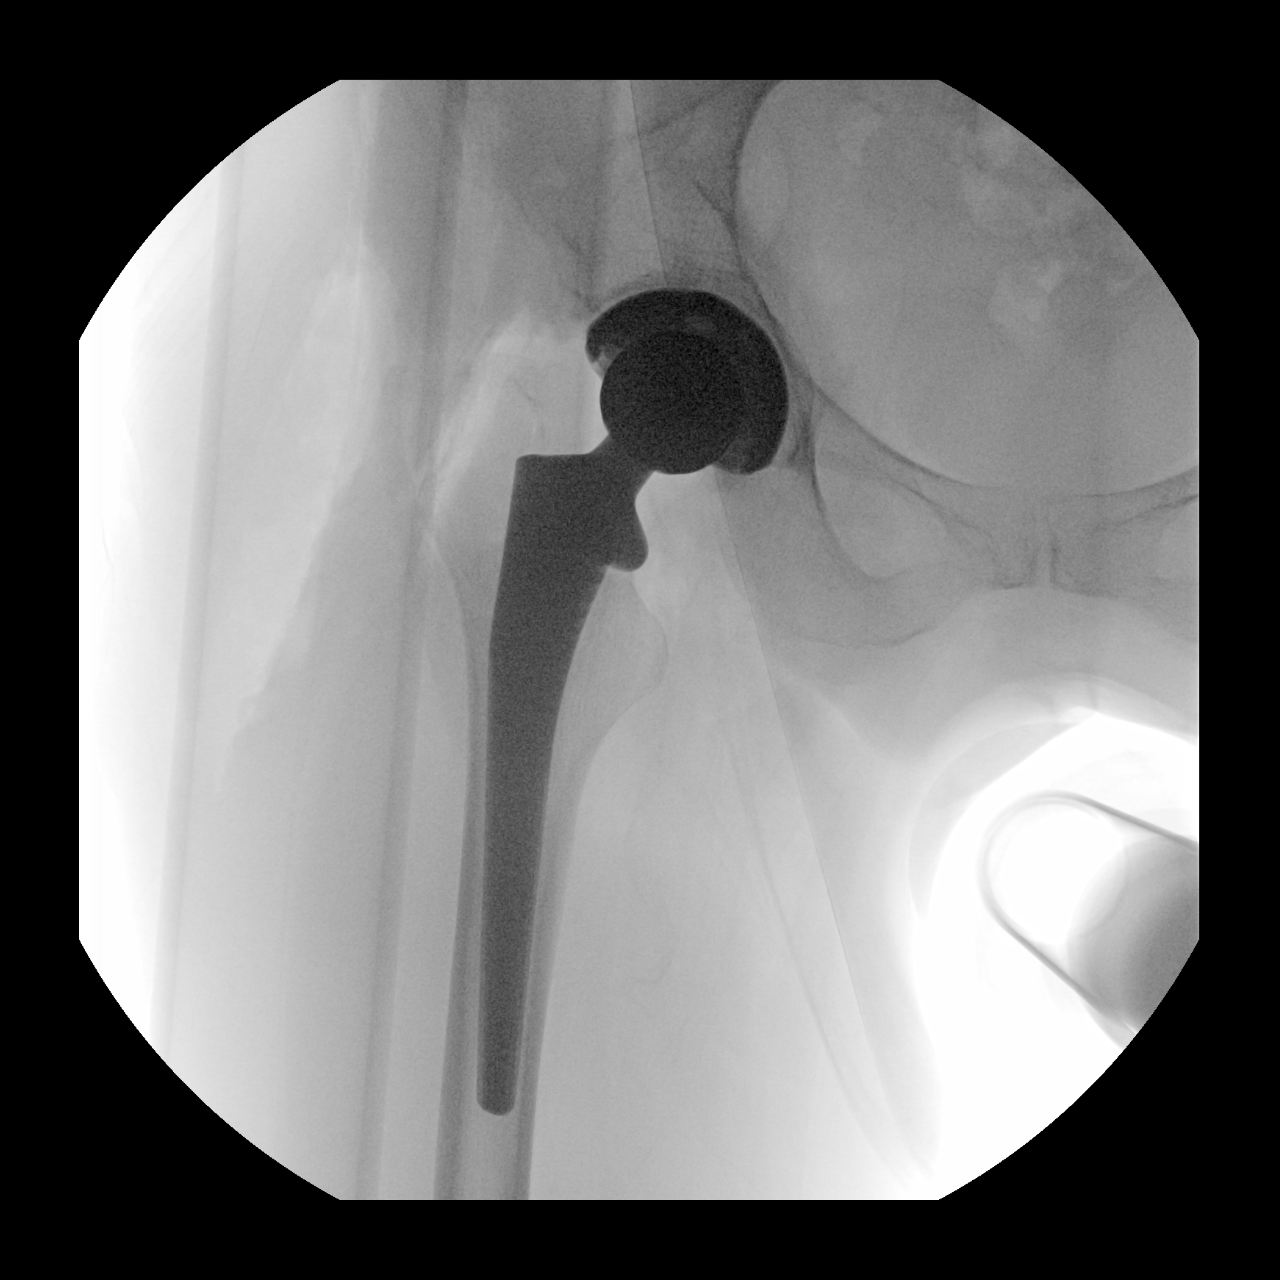
[im 3/3]
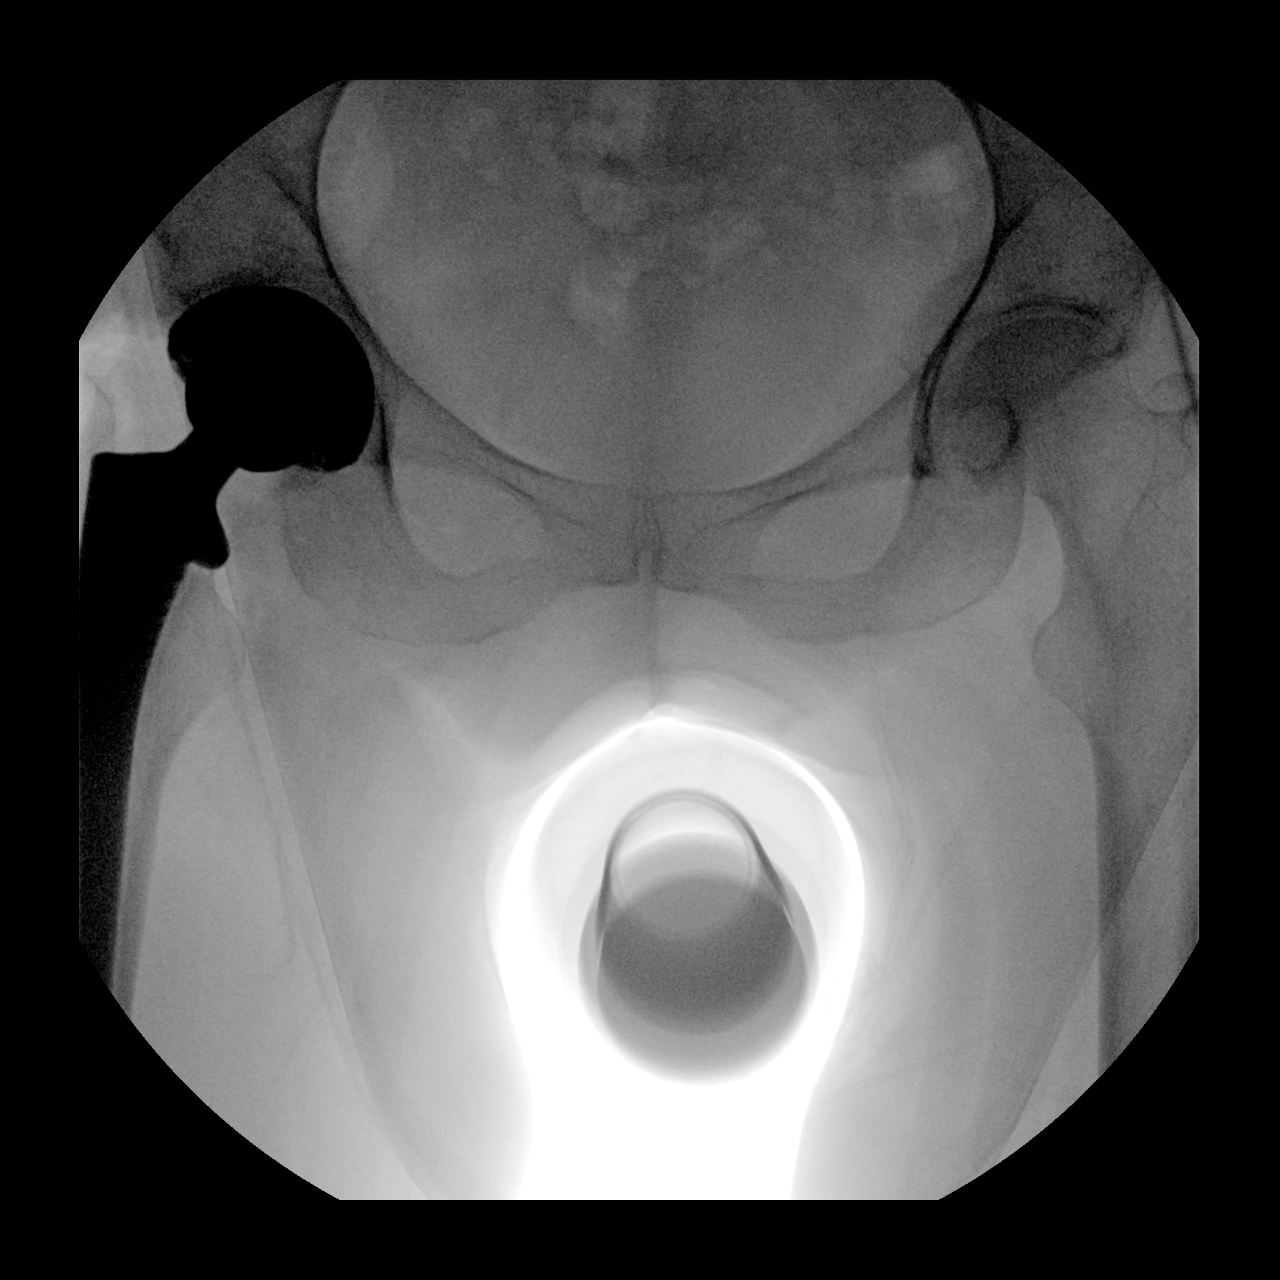

[3 of 3 positions shown; findings below may reference images not displayed]

FINDINGS: Frontal and oblique views obtained. Total hip replacement noted on
the right with prosthetic components well-seated. No fracture or
dislocation. Mild narrowing left hip joint noted.
IMPRESSION: Total hip replacement on the right with prosthetic components
appearing well-seated. No fracture or dislocation.

## 2021-05-30 ENCOUNTER — Telehealth: Payer: Self-pay | Admitting: *Deleted

## 2021-05-30 NOTE — Telephone Encounter (Signed)
Ortho bundle 1 year call completed with survey.

## 2021-06-15 IMAGING — MG DIGITAL DIAGNOSTIC BILAT W/ TOMO W/ CAD
6 of 10 series · 6 of 30 positions shown · non-contrast
Comparison: Previous exam(s).

CLINICAL DATA: 64-year-old female presenting for evaluation of
focal pain in the right breast identified on clinical breast exam.
The area is only sensitive when pressed.

EXAM:
DIGITAL DIAGNOSTIC BILATERAL MAMMOGRAM WITH TOMO AND CAD; ULTRASOUND
RIGHT BREAST LIMITED

[R MLO synth-2D]
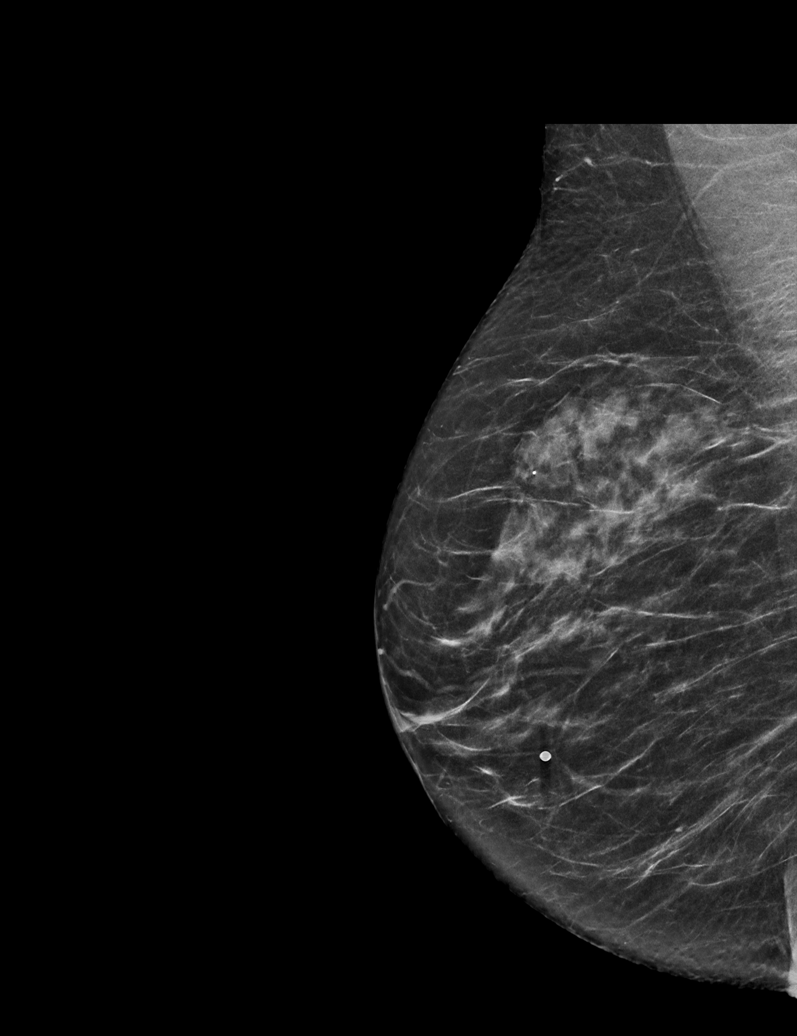

[R TAN synth-2D]
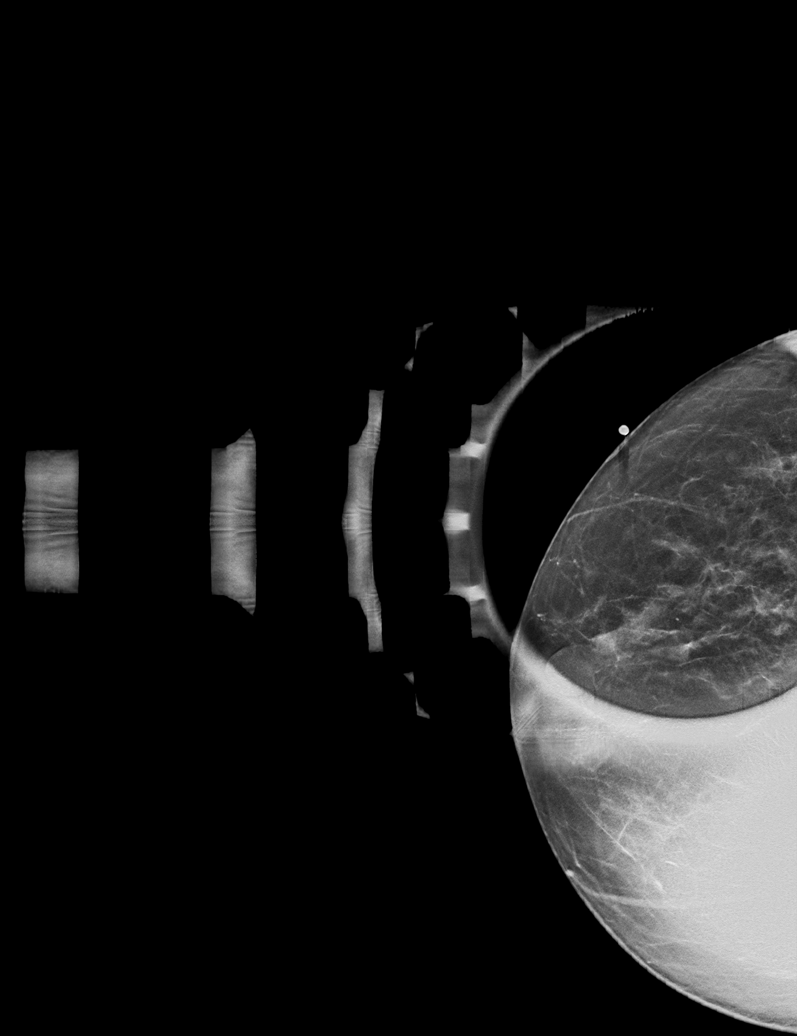

[R CC synth-2D]
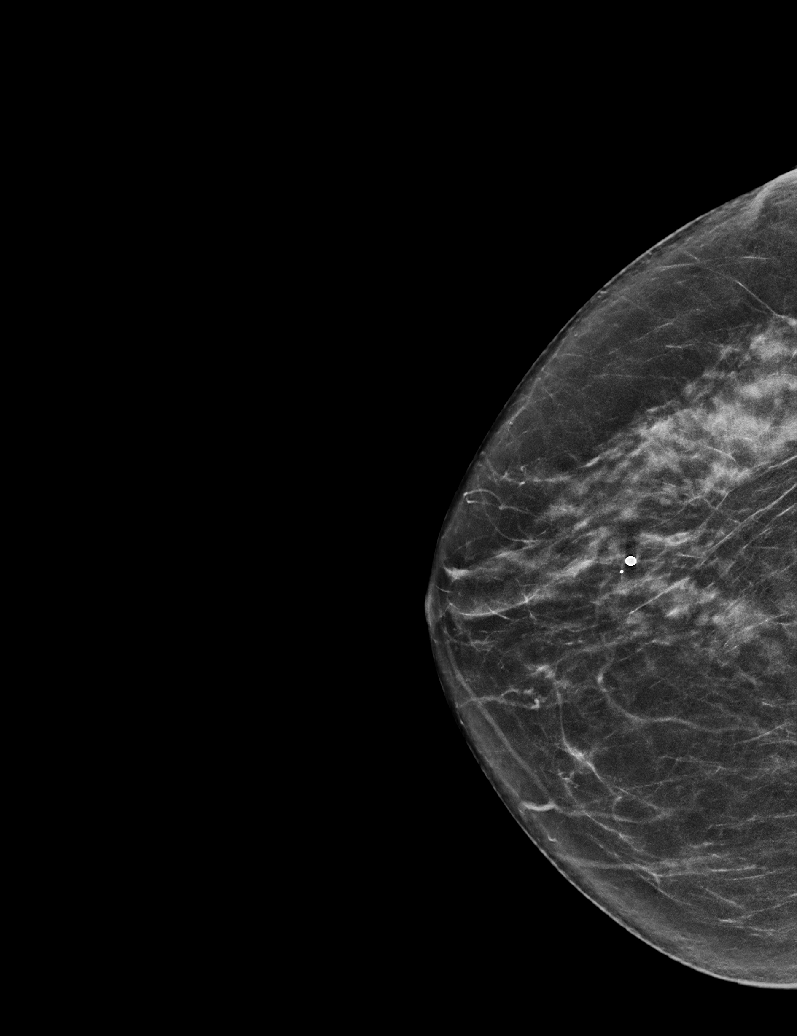

[L MLO synth-2D]
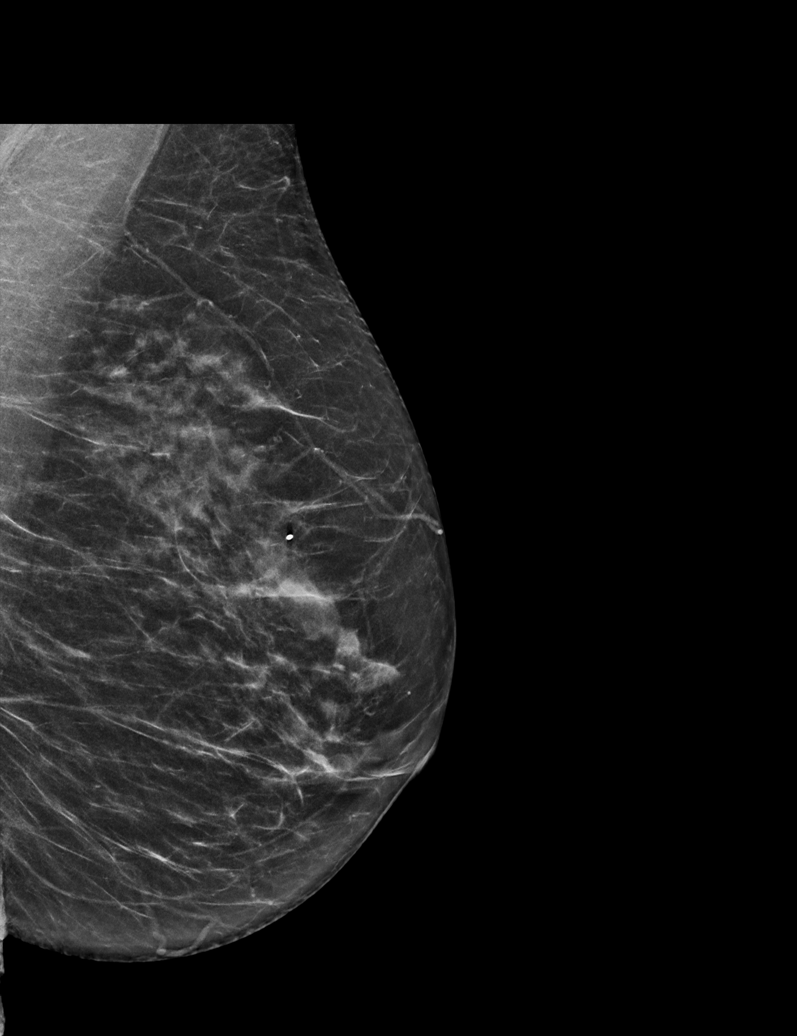

[L CC synth-2D]
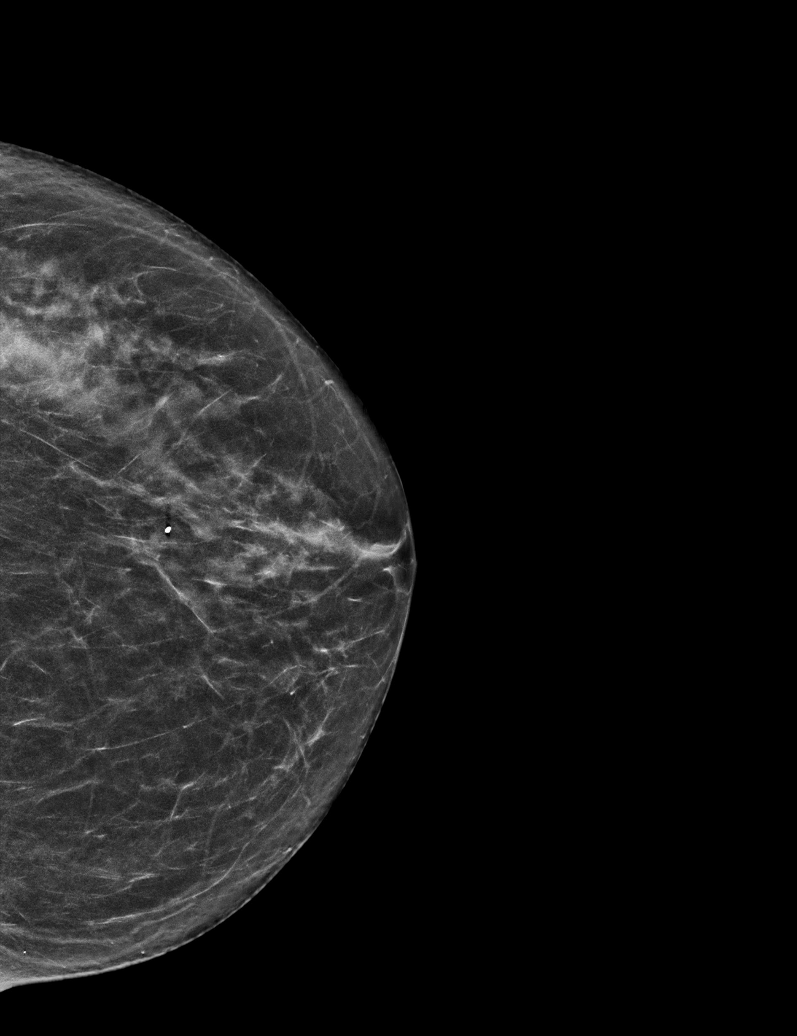

[R MLO tomo · tomo slice 29/57.0]
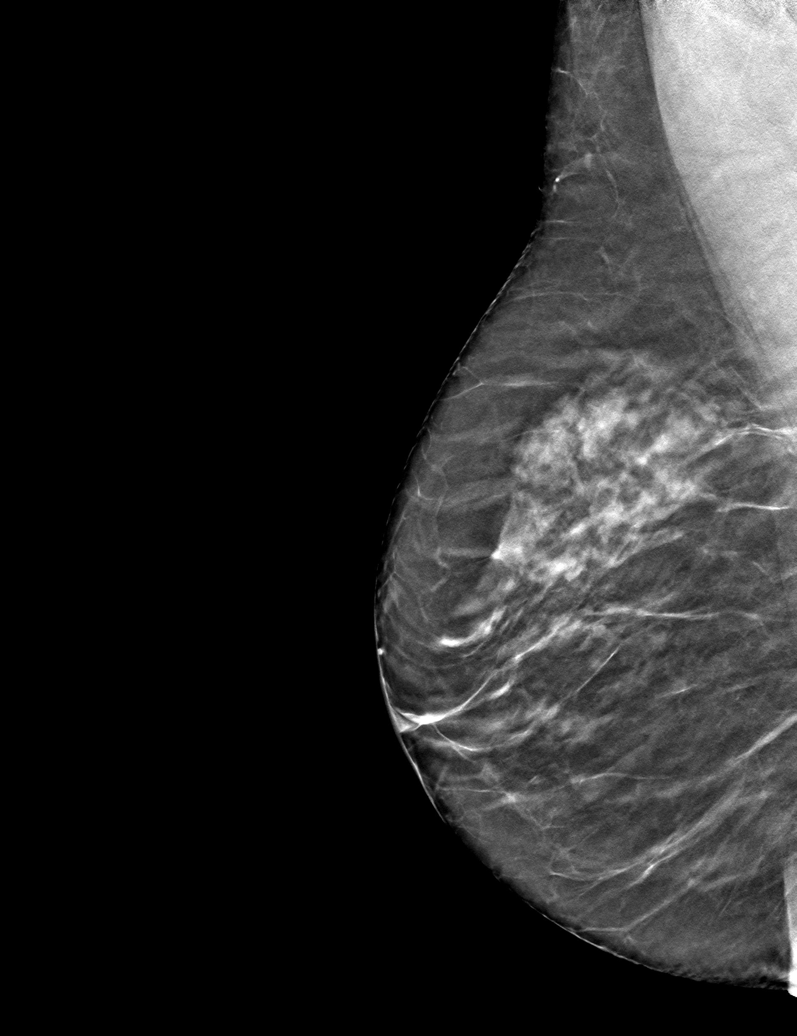

[6 of 30 positions shown; findings below may reference images not displayed]

ACR Breast Density Category c: The breast tissue is heterogeneously
dense, which may obscure small masses.
FINDINGS: No suspicious calcifications, masses or areas of distortion are seen
in the bilateral breasts. No abnormal findings are seen deep to the
skin marker in the lower outer right breast to correspond with the
site of focal tenderness.

Mammographic images were processed with CAD.

Ultrasound targeted to the right breast at 8 o'clock, 3 cm from the
nipple in the region of pain demonstrates normal fibroglandular
tissue. No suspicious masses or areas of shadowing are identified.
IMPRESSION: 1. There are no mammographic or targeted sonographic abnormalities
at the site of focal pain in the lower outer right breast.

2.  No evidence of malignancy in the bilateral breasts.

RECOMMENDATION:
1. Clinical follow-up recommended for the focally tender area of
concern in the right breast. Any further workup should be based on
clinical grounds.

2.  Screening mammogram in one year.(Code:[id number])

I have discussed the findings and recommendations with the patient.
If applicable, a reminder letter will be sent to the patient
regarding the next appointment.

BI-RADS CATEGORY  1: Negative.

## 2021-06-26 NOTE — Telephone Encounter (Signed)
error
